# Patient Record
Sex: Female | Born: 1970 | Race: White | Hispanic: No | Marital: Married | Smoking: Current every day smoker
Health system: Southern US, Community
[De-identification: ages and names within clinical notes are randomized; demographics above are authoritative.]

## PROBLEM LIST (undated history)

## (undated) DIAGNOSIS — F1911 Other psychoactive substance abuse, in remission: Secondary | ICD-10-CM

## (undated) DIAGNOSIS — T7412XA Child physical abuse, confirmed, initial encounter: Secondary | ICD-10-CM

## (undated) DIAGNOSIS — E7800 Pure hypercholesterolemia, unspecified: Secondary | ICD-10-CM

## (undated) DIAGNOSIS — E785 Hyperlipidemia, unspecified: Secondary | ICD-10-CM

## (undated) DIAGNOSIS — M549 Dorsalgia, unspecified: Secondary | ICD-10-CM

## (undated) DIAGNOSIS — F1011 Alcohol abuse, in remission: Secondary | ICD-10-CM

## (undated) DIAGNOSIS — B179 Acute viral hepatitis, unspecified: Secondary | ICD-10-CM

## (undated) DIAGNOSIS — F319 Bipolar disorder, unspecified: Secondary | ICD-10-CM

## (undated) DIAGNOSIS — F4310 Post-traumatic stress disorder, unspecified: Secondary | ICD-10-CM

## (undated) HISTORY — DX: Dorsalgia, unspecified: M54.9

## (undated) HISTORY — DX: Child physical abuse, confirmed, initial encounter: T74.12XA

## (undated) HISTORY — DX: Bipolar disorder, unspecified: F31.9

## (undated) HISTORY — PX: ABDOMINAL HYSTERECTOMY: SHX81

## (undated) HISTORY — DX: Hyperlipidemia, unspecified: E78.5

## (undated) HISTORY — DX: Post-traumatic stress disorder, unspecified: F43.10

---

## 2001-03-27 ENCOUNTER — Inpatient Hospital Stay (HOSPITAL_COMMUNITY): Admission: EM | Admit: 2001-03-27 | Discharge: 2001-03-29 | Payer: Self-pay | Admitting: Psychiatry

## 2004-03-11 ENCOUNTER — Ambulatory Visit: Payer: Self-pay | Admitting: Psychiatry

## 2004-05-18 ENCOUNTER — Ambulatory Visit: Payer: Self-pay | Admitting: Psychiatry

## 2004-07-13 ENCOUNTER — Ambulatory Visit: Payer: Self-pay | Admitting: Psychiatry

## 2004-11-04 ENCOUNTER — Ambulatory Visit: Payer: Self-pay | Admitting: Psychiatry

## 2004-11-07 ENCOUNTER — Emergency Department (HOSPITAL_COMMUNITY): Admission: EM | Admit: 2004-11-07 | Discharge: 2004-11-07 | Payer: Self-pay | Admitting: Emergency Medicine

## 2004-11-16 ENCOUNTER — Emergency Department (HOSPITAL_COMMUNITY): Admission: EM | Admit: 2004-11-16 | Discharge: 2004-11-16 | Payer: Self-pay | Admitting: Emergency Medicine

## 2004-12-23 ENCOUNTER — Ambulatory Visit: Payer: Self-pay | Admitting: Psychiatry

## 2005-01-12 ENCOUNTER — Inpatient Hospital Stay (HOSPITAL_COMMUNITY): Admission: RE | Admit: 2005-01-12 | Discharge: 2005-01-13 | Payer: Self-pay | Admitting: Psychiatry

## 2005-02-17 ENCOUNTER — Ambulatory Visit: Payer: Self-pay | Admitting: Psychiatry

## 2005-04-26 ENCOUNTER — Ambulatory Visit: Payer: Self-pay | Admitting: Psychiatry

## 2005-07-21 ENCOUNTER — Ambulatory Visit: Payer: Self-pay | Admitting: Psychiatry

## 2005-09-22 ENCOUNTER — Ambulatory Visit (HOSPITAL_COMMUNITY): Payer: Self-pay | Admitting: Psychiatry

## 2005-10-20 ENCOUNTER — Ambulatory Visit (HOSPITAL_COMMUNITY): Payer: Self-pay | Admitting: Psychiatry

## 2006-01-10 ENCOUNTER — Ambulatory Visit (HOSPITAL_COMMUNITY): Payer: Self-pay | Admitting: Psychiatry

## 2006-03-09 ENCOUNTER — Ambulatory Visit (HOSPITAL_COMMUNITY): Payer: Self-pay | Admitting: Psychiatry

## 2006-05-04 ENCOUNTER — Ambulatory Visit (HOSPITAL_COMMUNITY): Payer: Self-pay | Admitting: Psychiatry

## 2006-06-29 ENCOUNTER — Ambulatory Visit (HOSPITAL_COMMUNITY): Payer: Self-pay | Admitting: Psychiatry

## 2006-08-24 ENCOUNTER — Ambulatory Visit (HOSPITAL_COMMUNITY): Payer: Self-pay | Admitting: Psychiatry

## 2006-10-24 ENCOUNTER — Ambulatory Visit (HOSPITAL_COMMUNITY): Payer: Self-pay | Admitting: Psychiatry

## 2006-12-21 ENCOUNTER — Ambulatory Visit (HOSPITAL_COMMUNITY): Payer: Self-pay | Admitting: Psychiatry

## 2007-04-17 ENCOUNTER — Ambulatory Visit (HOSPITAL_COMMUNITY): Payer: Self-pay | Admitting: Psychiatry

## 2007-06-05 ENCOUNTER — Ambulatory Visit (HOSPITAL_COMMUNITY): Payer: Self-pay | Admitting: Psychiatry

## 2007-08-02 ENCOUNTER — Ambulatory Visit (HOSPITAL_COMMUNITY): Payer: Self-pay | Admitting: Psychiatry

## 2007-11-02 ENCOUNTER — Ambulatory Visit: Payer: Self-pay | Admitting: Orthopedic Surgery

## 2007-11-02 ENCOUNTER — Emergency Department (HOSPITAL_COMMUNITY): Admission: EM | Admit: 2007-11-02 | Discharge: 2007-11-02 | Payer: Self-pay | Admitting: Emergency Medicine

## 2007-11-13 ENCOUNTER — Ambulatory Visit (HOSPITAL_COMMUNITY): Payer: Self-pay | Admitting: Psychiatry

## 2007-11-22 ENCOUNTER — Telehealth: Payer: Self-pay | Admitting: Orthopedic Surgery

## 2007-11-26 ENCOUNTER — Ambulatory Visit: Payer: Self-pay | Admitting: Orthopedic Surgery

## 2007-11-26 DIAGNOSIS — S53196A Other dislocation of unspecified ulnohumeral joint, initial encounter: Secondary | ICD-10-CM | POA: Insufficient documentation

## 2007-11-28 ENCOUNTER — Encounter: Payer: Self-pay | Admitting: Orthopedic Surgery

## 2007-12-10 ENCOUNTER — Telehealth: Payer: Self-pay | Admitting: Orthopedic Surgery

## 2008-01-10 ENCOUNTER — Ambulatory Visit (HOSPITAL_COMMUNITY): Payer: Self-pay | Admitting: Psychiatry

## 2008-03-11 ENCOUNTER — Ambulatory Visit (HOSPITAL_COMMUNITY): Payer: Self-pay | Admitting: Psychiatry

## 2008-07-15 ENCOUNTER — Ambulatory Visit (HOSPITAL_COMMUNITY): Payer: Self-pay | Admitting: Psychiatry

## 2008-10-09 ENCOUNTER — Ambulatory Visit (HOSPITAL_COMMUNITY): Payer: Self-pay | Admitting: Psychiatry

## 2008-11-06 ENCOUNTER — Ambulatory Visit (HOSPITAL_COMMUNITY): Payer: Self-pay | Admitting: Psychiatry

## 2008-12-02 ENCOUNTER — Emergency Department (HOSPITAL_COMMUNITY): Admission: EM | Admit: 2008-12-02 | Discharge: 2008-12-02 | Payer: Self-pay | Admitting: Emergency Medicine

## 2008-12-04 ENCOUNTER — Ambulatory Visit (HOSPITAL_COMMUNITY): Payer: Self-pay | Admitting: Psychiatry

## 2009-02-03 ENCOUNTER — Ambulatory Visit (HOSPITAL_COMMUNITY): Payer: Self-pay | Admitting: Psychiatry

## 2009-03-11 ENCOUNTER — Emergency Department (HOSPITAL_COMMUNITY): Admission: EM | Admit: 2009-03-11 | Discharge: 2009-03-12 | Payer: Self-pay | Admitting: Emergency Medicine

## 2009-03-12 ENCOUNTER — Ambulatory Visit (HOSPITAL_COMMUNITY): Admission: RE | Admit: 2009-03-12 | Discharge: 2009-03-12 | Payer: Self-pay | Admitting: Otolaryngology

## 2009-04-02 ENCOUNTER — Ambulatory Visit (HOSPITAL_COMMUNITY): Payer: Self-pay | Admitting: Psychiatry

## 2009-05-28 ENCOUNTER — Ambulatory Visit (HOSPITAL_COMMUNITY): Payer: Self-pay | Admitting: Psychiatry

## 2009-07-28 ENCOUNTER — Ambulatory Visit (HOSPITAL_COMMUNITY): Payer: Self-pay | Admitting: Psychiatry

## 2009-09-24 ENCOUNTER — Ambulatory Visit (HOSPITAL_COMMUNITY): Payer: Self-pay | Admitting: Psychiatry

## 2009-11-24 ENCOUNTER — Ambulatory Visit (HOSPITAL_COMMUNITY): Payer: Self-pay | Admitting: Psychiatry

## 2010-01-19 ENCOUNTER — Ambulatory Visit (HOSPITAL_COMMUNITY): Payer: Self-pay | Admitting: Psychiatry

## 2010-03-09 ENCOUNTER — Ambulatory Visit (HOSPITAL_COMMUNITY): Payer: Self-pay | Admitting: Psychiatry

## 2010-05-06 ENCOUNTER — Emergency Department (HOSPITAL_COMMUNITY): Admission: EM | Admit: 2010-05-06 | Discharge: 2010-05-06 | Payer: Self-pay | Admitting: Emergency Medicine

## 2010-05-11 ENCOUNTER — Ambulatory Visit (HOSPITAL_COMMUNITY): Payer: Self-pay | Admitting: Psychiatry

## 2010-06-07 ENCOUNTER — Ambulatory Visit (HOSPITAL_COMMUNITY): Payer: Self-pay | Admitting: Psychiatry

## 2010-06-21 ENCOUNTER — Ambulatory Visit (HOSPITAL_COMMUNITY): Payer: Self-pay | Admitting: Psychiatry

## 2010-07-08 ENCOUNTER — Ambulatory Visit (HOSPITAL_COMMUNITY)
Admission: RE | Admit: 2010-07-08 | Discharge: 2010-07-08 | Payer: Self-pay | Source: Home / Self Care | Attending: Psychiatry | Admitting: Psychiatry

## 2010-07-19 ENCOUNTER — Ambulatory Visit (HOSPITAL_COMMUNITY): Admit: 2010-07-19 | Payer: Self-pay | Admitting: Psychiatry

## 2010-09-02 ENCOUNTER — Encounter (INDEPENDENT_AMBULATORY_CARE_PROVIDER_SITE_OTHER): Payer: Medicaid Other | Admitting: Psychiatry

## 2010-09-02 DIAGNOSIS — F39 Unspecified mood [affective] disorder: Secondary | ICD-10-CM

## 2010-10-16 ENCOUNTER — Emergency Department (HOSPITAL_COMMUNITY)
Admission: EM | Admit: 2010-10-16 | Discharge: 2010-10-16 | Disposition: A | Discharge status: Home / Self Care | Payer: Medicaid Other | Attending: Emergency Medicine | Admitting: Emergency Medicine

## 2010-10-16 DIAGNOSIS — M545 Low back pain, unspecified: Secondary | ICD-10-CM | POA: Insufficient documentation

## 2010-10-16 DIAGNOSIS — W010XXA Fall on same level from slipping, tripping and stumbling without subsequent striking against object, initial encounter: Secondary | ICD-10-CM | POA: Insufficient documentation

## 2010-10-16 DIAGNOSIS — F3289 Other specified depressive episodes: Secondary | ICD-10-CM | POA: Insufficient documentation

## 2010-10-16 DIAGNOSIS — M546 Pain in thoracic spine: Secondary | ICD-10-CM | POA: Insufficient documentation

## 2010-10-16 DIAGNOSIS — IMO0002 Reserved for concepts with insufficient information to code with codable children: Secondary | ICD-10-CM | POA: Insufficient documentation

## 2010-10-16 DIAGNOSIS — F329 Major depressive disorder, single episode, unspecified: Secondary | ICD-10-CM | POA: Insufficient documentation

## 2010-10-16 DIAGNOSIS — Y9210 Unspecified residential institution as the place of occurrence of the external cause: Secondary | ICD-10-CM | POA: Insufficient documentation

## 2010-10-28 ENCOUNTER — Encounter (INDEPENDENT_AMBULATORY_CARE_PROVIDER_SITE_OTHER): Payer: Medicaid Other | Admitting: Psychiatry

## 2010-10-28 DIAGNOSIS — F39 Unspecified mood [affective] disorder: Secondary | ICD-10-CM

## 2010-11-02 NOTE — Consult Note (Signed)
Kari Le             ACCOUNT NO.:  [id number]   MEDICAL RECORD NO.:  [id number]          PATIENT TYPE:  EMS   LOCATION:  ED                            FACILITY:  [facility]   PHYSICIAN:  Vickki Hearing, M.D.DATE OF BIRTH:  08/05/1970   DATE OF CONSULTATION:  11/02/2007  DATE OF DISCHARGE:  11/02/2007                                 CONSULTATION   CHIEF COMPLAINT:  Dislocated right elbow.   HISTORY:  I was asked to consult on a patient, Kari Le, a 39-  year-old female who actually injured her elbow 1 day prior to admission  to the emergency room.  She said she was afraid of hospitals.  She  complained of severe pain, swelling, deformity, no numbness or tingling  but difficulty moving her right elbow.  She described the pain as 10/10.  Her review of systems essentially was negative.  She does have some  mental health issues no major medical history.  No drug abuse.  She does  smoke.   ALLERGIES:  None known.   MEDICATIONS:  Prozac 20 mg a day and Premarin oral once a day.  No  dosage given.   PHYSICAL EXAMINATION:  VITAL SIGNS:  Stable.  CONSTITUTIONAL:  She was well-developed and well-nourished in mild pain,  otherwise no distress.  She was awake, alert, and oriented x3.  Mood and  affect were normal.  CARDIOVASCULAR:  Normal pulse and perfusion to the right upper  extremity.  SKIN:  Intact.  NEUROLOGIC:  Normal.  EXTREMITIES:  Right elbow was deformed.  Pain and swelling was noted  around the elbow.  Joint effusion was noted.   After assessment looking at the x-rays, we decided to a closed reduction  under conscious sedation.  See report.   DIAGNOSIS:  Closed right posterior elbow dislocation.   PLAN:  Closed reduction under anesthesia with conscious sedation.      Vickki Hearing, M.D.  Electronically Signed     SEH/MEDQ  D:  11/28/2007  T:  11/29/2007  Job:  [id number]

## 2010-11-02 NOTE — Op Note (Signed)
NAMENILE, DORNING             ACCOUNT NO.:  [id number]   MEDICAL RECORD NO.:  [id number]          PATIENT TYPE:  EMS   LOCATION:  ED                            FACILITY:  [facility]   PHYSICIAN:  Vickki Hearing, M.D.DATE OF BIRTH:  [birth year]   DATE OF PROCEDURE:  11/02/2007  DATE OF DISCHARGE:  11/02/2007                               OPERATIVE REPORT   PREPROCEDURE DIAGNOSIS:  Right closed posterior elbow dislocation.   POSTPROCEDURE DIAGNOSIS:  Right closed posterior elbow dislocation.   PROCEDURE:  Closed reduction, right elbow under conscious sedation.   SURGEON:  Vickki Hearing, MD.   ANESTHETIC:  Versed and Diprivan.   OPERATIVE FINDINGS:  Posterolateral elbow dislocation, closed.   PROCEDURE:  After proper identification of the patient and site, consent  and history were done, administering of Versed and Diprivan, the patient  was sedated with stable vital signs.  Closed manipulation was performed  with standard maneuver of traction and countertraction and lateral and  medial pressure.  Elbow was reduced with a loud and palpable clock and  then radiographs were taken to confirm the reduction.  The patient was  placed in a posterior splint.   She was discharged on same day with pain medication and a request to  follow up in 1 week.   Dictation ended at this point.      Vickki Hearing, M.D.  Electronically Signed     SEH/MEDQ  D:  11/28/2007  T:  11/29/2007  Job:  [id number]

## 2010-11-05 NOTE — Discharge Summary (Signed)
[facility]  Patient:    Kari Le, Kari Le Visit Number: [id number] MRN: [id number]          Service Type: PSY Location: 50 [id number] Attending Physician:  Rachael Fee Dictated by:   Reymundo Poll Dub Mikes, M.D. Admit Date:  03/27/2001 Discharge Date: 03/29/2001                             Discharge Summary  CHIEF COMPLAINT AND HISTORY OF PRESENT ILLNESS:  This was the first admission to [facility] for this 40 year old female admitted for depression and self-inflicted injuries.  The depression had been increasing over the last several days prior to this admission, became very despondent, relapsed on alcohol and drug use, cut herself with scissors, feeling hopeless, afraid to tell her family of her drug use.  She realized what she had done, came to the emergency room.  Reported stress over the separation and impending divorce, turned to alcohol and crack cocaine. Sleeping has been decreased, reporting initial insomnia.  Appetite has been satisfactory.  No auditory or visual hallucinations or paranoia.  PAST PSYCHIATRIC HISTORY:  Saw Kari Le in [place] for the last several months prior to this admission.  SUBSTANCE ABUSE HISTORY:  Used to drink heavily.  Relapsed a few months ago. Last drink was two days prior to this admission, drinking beer.  Crack cocaine was smoked with her friend when her friend would come over.  PAST MEDICAL HISTORY:  Back problems.  MEDICATIONS ON ADMISSION: 1. Prozac 20 mg three times a day. 2. Xanax 1 mg four times a day. 3. Hormone replacement for hysterectomy.  PHYSICAL EXAMINATION:  GENERAL:  Performed at [facility] did show superficial abrasions to her left wrist, scant amount of drainage.  LABORATORY DATA:  Urine drug screen was positive for benzodiazepines and cocaine.  Urine was positive for bacteria.  MENTAL STATUS EXAMINATION ON ADMISSION:  Alert, cooperative, female  casually dressed, good eye contact.  Speech was normal and relevant.  Mood was depressed.  Affect was flat.  Thought processes were coherent.  No evidence of psychosis, no auditory or visual hallucinations, no suicidal or homicidal ideations.  Cognitive: Intact.  ADMITTING DIAGNOSES: Axis I:    1. Major depression, recurrent.            2. Recreational alcohol abuse.            3. Cocaine abuse. Axis II:   Deferred. Axis III:  Back pain. Axis IV:   Moderate. Axis V:    Global assessment of functioning upon admission 40, highest global            assessment of functioning in the last year 65-70.  HOSPITAL COURSE:  She was admitted and started in intensive individual and group psychotherapy.  She was given Prozac 20 mg per day and trazodone as needed for sleep.  She was given Bactrim for a urinary tract infection.  On October 10, she was in full contact with reality.   Mood had improved.  Affect was brighter.  She denied any suicidal or homicidal ideas.  She had worked on crisis intervention, coping skills, and stress management.  She felt that she was ready to go and follow up with Dr. Katrinka Blazing and her individual therapist. She was willing to pursue further recovery work, going to the [facility], for which discharge was requested and granted.  DISCHARGE DIAGNOSES: Axis I:  1. Major depression, recurrent.            2. Alcohol and cocaine abuse. Axis II:   No diagnosis. Axis III:  1. Back pain.            2. Urinary tract infection. Axis IV:   Moderate. Axis V:    Global assessment of functioning upon discharge 60.  DISCHARGE MEDICATIONS: 1. Prozac 20 mg daily. 2. Trazodone 50 mg at bedtime as needed for sleep. 3. Bactrim DS one twice a day for five more days.  FOLLOWUP:  Follow up with Kari Le, M.D. as well as an individual counselor. Dictated by:   Reymundo Poll Dub Mikes, M.D. Attending Physician:  Rachael Fee DD:  05/09/01 TD:  05/11/01 Job: [id number]

## 2010-11-05 NOTE — Discharge Summary (Signed)
NAMEKANISHA, Kari Le             ACCOUNT NO.:  [id number]   MEDICAL RECORD NO.:  [id number]          PATIENT TYPE:  IPS   LOCATION:  0501                          FACILITY:  [facility]   PHYSICIAN:  Anselm Jungling, MD  DATE OF BIRTH:  [birth year]   DATE OF ADMISSION:  01/12/2005  DATE OF DISCHARGE:  01/13/2005                                 DISCHARGE SUMMARY   IDENTIFYING DATA/REASON FOR ADMISSION:  This is the second [facility] admission for  Kari Le, a 40 year old separated mother, who was admitted due to increasing  depression, suicidal ideation, and polysubstance abuse.  She comes to [place]  with a history of bipolar affective disorder and polysubstance abuse.  She  was previously hospitalized here at [facility] approximately one year ago.  At the  time of admission, she was taking a regimen of medication including Prozac  40 mg daily, Risperdal, dosage unclear at bedtime, Xanax 1 mg q.i.d. and  trazodone 100 mg q.h.s.  Please refer to the admission note for further  details pertaining to the symptoms, circumstances and history that led to  her hospitalization.   INITIAL DIAGNOSTIC IMPRESSION:  AXIS I:  Bipolar affective disorder, by  history, currently depressed without psychotic features.  Polysubstance  abuse.  AXIS II:  Deferred.  AXIS III:  Allergy to TORADOL and DARVOCET.  AXIS IV:  Stressors:  Severe.  AXIS V:  GAF 30.   MEDICAL/LABORATORY:  The patient was physically examined and cleared at  [facility] prior to transfer to the inpatient psychiatric service at  [facility].  Her toxicology screen was positive for benzodiazepines and THC.   HOSPITAL COURSE:  The patient was admitted to the adult inpatient  psychiatric service.  Symmetrel 100 mg b.i.d. was ordered to address cocaine  craving and a Librium withdrawal protocol was introduced to address  potential alcohol withdrawal symptoms.  The patient presented as pleasant,  well-organized and, once admitted, she denied suicidal  ideation.   On the second hospital day, she requested discharge.  She indicated that she  was ready and willing to participate in outpatient follow-up treatment.  She  declined the offer of a family meeting.   AFTERCARE:  The patient was to follow up with Dr. Lolly Mustache in our outpatient  clinic, to be arranged at the time of discharge.   DISCHARGE MEDICATIONS:  She was instructed to continue her same medications  as above.  She still had a supply of all her medications at home and was  instructed to take them as directed on the labels.   DISCHARGE DIAGNOSES:  AXIS I:  Bipolar affective disorder, by history.  Polysubstance abuse.  AXIS II:  Deferred.  AXIS III:  Allergy to TORADOL and DARVOCET.  AXIS IV:  Stressors:  Severe.  AXIS V:  GAF on discharge 50.           ______________________________  Anselm Jungling, MD  Electronically Signed     SPB/MEDQ  D:  02/01/2005  T:  02/01/2005  Job:  [id number]

## 2010-12-28 ENCOUNTER — Encounter (HOSPITAL_COMMUNITY): Payer: Medicaid Other | Admitting: Psychiatry

## 2010-12-30 ENCOUNTER — Encounter (INDEPENDENT_AMBULATORY_CARE_PROVIDER_SITE_OTHER): Payer: Medicaid Other | Admitting: Psychiatry

## 2010-12-30 ENCOUNTER — Encounter (HOSPITAL_COMMUNITY): Payer: Medicaid Other | Admitting: Psychiatry

## 2010-12-30 DIAGNOSIS — F39 Unspecified mood [affective] disorder: Secondary | ICD-10-CM

## 2011-01-13 ENCOUNTER — Encounter: Payer: Self-pay | Admitting: *Deleted

## 2011-01-13 ENCOUNTER — Emergency Department (HOSPITAL_COMMUNITY)
Admission: EM | Admit: 2011-01-13 | Discharge: 2011-01-13 | Disposition: A | Discharge status: Home / Self Care | Payer: Medicaid Other | Attending: Emergency Medicine | Admitting: Emergency Medicine

## 2011-01-13 DIAGNOSIS — S39012A Strain of muscle, fascia and tendon of lower back, initial encounter: Secondary | ICD-10-CM

## 2011-01-13 DIAGNOSIS — X500XXA Overexertion from strenuous movement or load, initial encounter: Secondary | ICD-10-CM | POA: Insufficient documentation

## 2011-01-13 DIAGNOSIS — S335XXA Sprain of ligaments of lumbar spine, initial encounter: Secondary | ICD-10-CM | POA: Insufficient documentation

## 2011-01-13 DIAGNOSIS — F17200 Nicotine dependence, unspecified, uncomplicated: Secondary | ICD-10-CM | POA: Insufficient documentation

## 2011-01-13 HISTORY — DX: Pure hypercholesterolemia, unspecified: E78.00

## 2011-01-13 MED ORDER — CYCLOBENZAPRINE HCL 10 MG PO TABS: 10.0000 mg | ORAL_TABLET | Freq: Three times a day (TID) | ORAL | Status: AC | PRN

## 2011-01-13 MED ORDER — OXYCODONE-ACETAMINOPHEN 5-325 MG PO TABS: 1.0000 | ORAL_TABLET | ORAL | Status: AC | PRN

## 2011-01-13 NOTE — ED Provider Notes (Signed)
History     Chief Complaint  Patient presents with  . Back Pain   Patient is a 40 y.o. female presenting with back pain. The history is provided by the patient.  Back Pain  The current episode started yesterday. The problem occurs constantly. The problem has not changed since onset.The pain is associated with lifting heavy objects. The pain is present in the lumbar spine. The quality of the pain is described as aching. The pain does not radiate. The pain is mild. The symptoms are aggravated by bending, twisting and certain positions. The pain is the same all the time. Pertinent negatives include no chest pain, no fever, no numbness, no headaches, no abdominal pain, no abdominal swelling, no bowel incontinence, no perianal numbness, no bladder incontinence, no dysuria, no leg pain, no paresthesias and no weakness. She has tried nothing for the symptoms.    Past Medical History  Diagnosis Date  . High cholesterol     Past Surgical History  Procedure Date  . Abdominal hysterectomy     History reviewed. No pertinent family history.  History  Substance Use Topics  . Smoking status: Current Everyday Smoker -- 1.0 packs/day    Types: Cigarettes  . Smokeless tobacco: Not on file  . Alcohol Use: No    OB History    Grav Para Term Preterm Abortions TAB SAB Ect Mult Living                  Review of Systems  Constitutional: Negative for fever, chills and fatigue.  HENT: Negative for neck pain and neck stiffness.   Respiratory: Negative for cough, chest tightness and wheezing.   Cardiovascular: Negative.  Negative for chest pain.  Gastrointestinal: Negative for nausea, vomiting, abdominal pain, blood in stool, rectal pain and bowel incontinence.  Genitourinary: Negative for bladder incontinence, dysuria, frequency, hematuria, flank pain, decreased urine volume, vaginal bleeding, vaginal discharge and vaginal pain.  Musculoskeletal: Positive for back pain. Negative for joint swelling.   Skin: Negative.   Neurological: Negative for dizziness, weakness, numbness, headaches and paresthesias.  Hematological: Does not bruise/bleed easily.    Physical Exam  BP 114/89  Pulse 95  Temp(Src) 98 F (36.7 C) (Oral)  Resp 18  Ht 5' (1.524 m)  Wt 155 lb (70.308 kg)  BMI 30.27 kg/m2  SpO2 99%  Physical Exam  Nursing note and vitals reviewed. Constitutional: She appears well-developed and well-nourished.  Non-toxic appearance. She does not have a sickly appearance. No distress.  HENT:  Head: Normocephalic and atraumatic.  Eyes: Pupils are equal, round, and reactive to light.  Neck: Normal range of motion. Neck supple.  Cardiovascular: Normal rate, regular rhythm and normal heart sounds.   Pulmonary/Chest: Effort normal and breath sounds normal.  Abdominal: Soft. There is no tenderness.  Musculoskeletal: She exhibits tenderness. She exhibits no edema.       Lumbar back: She exhibits tenderness. She exhibits normal range of motion, no bony tenderness, no swelling, no spasm and normal pulse.       Back:  Lymphadenopathy:    She has no cervical adenopathy.  Neurological: She is alert. She has normal reflexes. She exhibits normal muscle tone. Coordination normal.  Skin: Skin is warm.    ED Course  Procedures  MDM  1500 Informed by triage nurse, that triage note regarding heart racing is charted on this patient by mistake.  Patient is ambulatory, moves all extremities well.  ttp of the lumbar paraspinal muscles.  No focal neuro deficits  or focal weakness.       Kingsley Farace L. Corley, [place] 01/13/11 1519

## 2011-01-13 NOTE — ED Notes (Signed)
Pt states has old elbow fracture and chronic back pain that was aggravated yesterday after assisting with moving heavy equipment.
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
## 2011-02-14 NOTE — ED Provider Notes (Signed)
History     CSN: [id number] Arrival date & time: 01/13/2011  1:38 PM  Chief Complaint  Patient presents with  . Back Pain   HPI  Past Medical History  Diagnosis Date  . High cholesterol     Past Surgical History  Procedure Date  . Abdominal hysterectomy     History reviewed. No pertinent family history.  History  Substance Use Topics  . Smoking status: Current Everyday Smoker -- 1.0 packs/day    Types: Cigarettes  . Smokeless tobacco: Not on file  . Alcohol Use: No    OB History    Grav Para Term Preterm Abortions TAB SAB Ect Mult Living                  Review of Systems  Physical Exam  BP 114/89  Pulse 95  Temp(Src) 98 F (36.7 C) (Oral)  Resp 18  Ht 5' (1.524 m)  Wt 155 lb (70.308 kg)  BMI 30.27 kg/m2  SpO2 99%  Physical Exam  ED Course  Procedures  MDM Medical screening examination/treatment/procedure(s) were performed by non-physician practitioner and as supervising physician I was immediately available for consultation/collaboration.      Donnetta Hutching, MD 02/14/11 [phone]

## 2011-03-01 ENCOUNTER — Encounter (INDEPENDENT_AMBULATORY_CARE_PROVIDER_SITE_OTHER): Payer: Medicaid Other | Admitting: Psychiatry

## 2011-03-01 DIAGNOSIS — F39 Unspecified mood [affective] disorder: Secondary | ICD-10-CM

## 2011-04-20 ENCOUNTER — Encounter (HOSPITAL_COMMUNITY): Payer: Self-pay | Admitting: *Deleted

## 2011-04-20 ENCOUNTER — Emergency Department (HOSPITAL_COMMUNITY)
Admission: EM | Admit: 2011-04-20 | Discharge: 2011-04-20 | Disposition: A | Discharge status: Home / Self Care | Payer: Medicaid Other | Attending: Emergency Medicine | Admitting: Emergency Medicine

## 2011-04-20 DIAGNOSIS — S39012A Strain of muscle, fascia and tendon of lower back, initial encounter: Secondary | ICD-10-CM

## 2011-04-20 DIAGNOSIS — S335XXA Sprain of ligaments of lumbar spine, initial encounter: Secondary | ICD-10-CM | POA: Insufficient documentation

## 2011-04-20 DIAGNOSIS — E7800 Pure hypercholesterolemia, unspecified: Secondary | ICD-10-CM | POA: Insufficient documentation

## 2011-04-20 DIAGNOSIS — X500XXA Overexertion from strenuous movement or load, initial encounter: Secondary | ICD-10-CM | POA: Insufficient documentation

## 2011-04-20 DIAGNOSIS — Z9079 Acquired absence of other genital organ(s): Secondary | ICD-10-CM | POA: Insufficient documentation

## 2011-04-20 DIAGNOSIS — F17200 Nicotine dependence, unspecified, uncomplicated: Secondary | ICD-10-CM | POA: Insufficient documentation

## 2011-04-20 MED ORDER — CYCLOBENZAPRINE HCL 10 MG PO TABS: ORAL_TABLET | ORAL | Status: DC

## 2011-04-20 MED ORDER — CYCLOBENZAPRINE HCL 10 MG PO TABS
10.0000 mg | ORAL_TABLET | Freq: Once | ORAL | Status: AC
  Administered 2011-04-20: 10 mg via ORAL
  Filled 2011-04-20: qty 1

## 2011-04-20 MED ORDER — IBUPROFEN 800 MG PO TABS
800.0000 mg | ORAL_TABLET | Freq: Once | ORAL | Status: AC
  Administered 2011-04-20: 800 mg via ORAL
  Filled 2011-04-20: qty 1

## 2011-04-20 NOTE — ED Notes (Signed)
Pt reports straining back while moving furniture.  Minimal relief with OTC medications.

## 2011-04-20 NOTE — ED Notes (Signed)
C/o back pain, pt states that she hurt her back while moving furniture,

## 2011-04-20 NOTE — ED Provider Notes (Signed)
History     CSN: [id number] Arrival date & time: 04/20/2011  7:52 PM   First MD Initiated Contact with Patient 04/20/11 1959      Chief Complaint  Patient presents with  . Back Pain    (Consider location/radiation/quality/duration/timing/severity/associated sxs/prior treatment) HPI Comments: Pt was helping her daughter move household belongings and strained her back.  Patient is a 40 y.o. female presenting with back pain. The history is provided by the patient. No language interpreter was used.  Back Pain  This is a new problem. The current episode started yesterday. The problem occurs constantly. The problem has not changed since onset.The pain is associated with lifting heavy objects. The pain is present in the lumbar spine. The quality of the pain is described as cramping. The pain does not radiate. The pain is moderate. The symptoms are aggravated by bending and twisting. She has tried NSAIDs for the symptoms. The treatment provided no relief.    Past Medical History  Diagnosis Date  . High cholesterol     Past Surgical History  Procedure Date  . Abdominal hysterectomy     History reviewed. No pertinent family history.  History  Substance Use Topics  . Smoking status: Current Everyday Smoker -- 1.0 packs/day    Types: Cigarettes  . Smokeless tobacco: Not on file  . Alcohol Use: No    OB History    Grav Para Term Preterm Abortions TAB SAB Ect Mult Living                  Review of Systems  Musculoskeletal: Positive for back pain.  All other systems reviewed and are negative.    Allergies  Hydrocodone; Toradol; and Ultram  Home Medications   Current Outpatient Rx  Name Route Sig Dispense Refill  . ALPRAZOLAM 1 MG PO TABS Oral Take 1 mg by mouth 3 (three) times daily.      Marland Kitchen ESTROGENS CONJUGATED 1.25 MG PO TABS Oral Take 1.25 mg by mouth daily.      Marland Kitchen FLUOXETINE HCL 40 MG PO CAPS Oral Take 40 mg by mouth daily.      Marland Kitchen MAGNESIUM SALICYLATE TETRAHYD 580  MG PO TABS Oral Take 1 tablet by mouth once as needed. For pain     . NAPROXEN SODIUM 220 MG PO TABS Oral Take 220 mg by mouth every 4 (four) hours. pain       BP 126/84  Pulse 102  Temp(Src) 97.7 F (36.5 C) (Oral)  Resp 18  Ht 5' (1.524 m)  Wt 160 lb (72.576 kg)  BMI 31.25 kg/m2  SpO2 100%  Physical Exam  Nursing note and vitals reviewed. Constitutional: She is oriented to person, place, and time. She appears well-developed and well-nourished. No distress.  HENT:  Head: Normocephalic and atraumatic.  Eyes: EOM are normal.  Neck: Normal range of motion.  Cardiovascular: Normal rate, regular rhythm and normal heart sounds.   Pulmonary/Chest: Effort normal and breath sounds normal.  Abdominal: Soft. She exhibits no distension. There is no tenderness.  Musculoskeletal:       Lumbar back: She exhibits decreased range of motion, tenderness and pain. She exhibits no bony tenderness, no deformity, no laceration and no spasm.       Back:  Neurological: She is alert and oriented to person, place, and time. She has normal strength. No cranial nerve deficit or sensory deficit. GCS eye subscore is 4. GCS verbal subscore is 5. GCS motor subscore is 6.  Reflex Scores:  Patellar reflexes are 2+ on the right side and 2+ on the left side.      Achilles reflexes are 2+ on the right side and 2+ on the left side. Skin: Skin is warm and dry.  Psychiatric: She has a normal mood and affect. Judgment normal.    ED Course  Procedures (including critical care time)  Labs Reviewed - No data to display No results found.   No diagnosis found.    MDM          Worthy Rancher, PA 04/20/11 [phone]

## 2011-04-21 NOTE — ED Provider Notes (Signed)
Medical screening examination/treatment/procedure(s) were performed by non-physician practitioner and as supervising physician I was immediately available for consultation/collaboration.  Konya Fauble, MD 04/21/11 0758 

## 2011-04-26 ENCOUNTER — Encounter (HOSPITAL_COMMUNITY): Payer: Self-pay | Admitting: Psychiatry

## 2011-04-26 ENCOUNTER — Ambulatory Visit (INDEPENDENT_AMBULATORY_CARE_PROVIDER_SITE_OTHER): Payer: Medicaid Other | Admitting: Psychiatry

## 2011-04-26 DIAGNOSIS — F319 Bipolar disorder, unspecified: Secondary | ICD-10-CM

## 2011-04-26 MED ORDER — RISPERIDONE 2 MG PO TABS: 2.0000 mg | ORAL_TABLET | Freq: Every day | ORAL | Status: DC

## 2011-04-26 MED ORDER — ALPRAZOLAM 1 MG PO TABS: 1.0000 mg | ORAL_TABLET | Freq: Three times a day (TID) | ORAL | Status: DC

## 2011-04-26 MED ORDER — BENZTROPINE MESYLATE 1 MG PO TABS: 1.0000 mg | ORAL_TABLET | Freq: Every day | ORAL | Status: DC

## 2011-04-26 MED ORDER — TRAZODONE HCL 150 MG PO TABS: 150.0000 mg | ORAL_TABLET | Freq: Every day | ORAL | Status: DC

## 2011-04-26 MED ORDER — FLUOXETINE HCL 40 MG PO CAPS: 40.0000 mg | ORAL_CAPSULE | Freq: Every day | ORAL | Status: DC

## 2011-04-26 NOTE — Patient Instructions (Signed)
Take your medications on regular basis and call us if you have any question or concern.

## 2011-04-26 NOTE — Progress Notes (Signed)
Patient came today for her followup appointment she has recently flulike symptoms and seen by her primary care doctor and now she is taking antibiotic for upper respiratory infection. She appears tired decreased energy and having some breathing problems. Overall she has been compliant with her psychiatric medication with no side effects. She reported her depression and anxiety and mood swings or under control. She's been sleeping 8-9 hours with the help of medication. She continued to endorse about family concerns and issues but lately things are much better. She denies any recent agitation anger or paranoid thinking. There are no abnormal movements noted. Mental status examination Patient is tired but casually dressed and appears to be her stated age. She described her mood okay and her affect was mood congruent. She denies any active or passive suicidal thinking homicidal thinking or any visual or auditory hallucination. Her attention and concentration were okay. Her thought process was slow but logical and linear. Her speech is soft and coherent. She is alert and oriented x3 and her insight judgment impulse control are fair. Assessment bipolar disorder type I Plan we will continue her medications which are respiratory Prozac Xanax trazodone Cogentin. I've explained the risks and benefits of medication in detail and I will see her again in 2 months.

## 2011-06-03 ENCOUNTER — Other Ambulatory Visit (HOSPITAL_COMMUNITY): Payer: Self-pay | Admitting: Psychiatry

## 2011-06-28 ENCOUNTER — Ambulatory Visit (INDEPENDENT_AMBULATORY_CARE_PROVIDER_SITE_OTHER): Payer: Medicaid Other | Admitting: Psychiatry

## 2011-06-28 DIAGNOSIS — F319 Bipolar disorder, unspecified: Secondary | ICD-10-CM

## 2011-06-28 MED ORDER — TRAZODONE HCL 150 MG PO TABS: 150.0000 mg | ORAL_TABLET | Freq: Every day | ORAL | Status: DC

## 2011-06-28 MED ORDER — BENZTROPINE MESYLATE 1 MG PO TABS: 1.0000 mg | ORAL_TABLET | Freq: Every day | ORAL | Status: DC

## 2011-06-28 MED ORDER — RISPERIDONE 2 MG PO TABS: 2.0000 mg | ORAL_TABLET | Freq: Every day | ORAL | Status: DC

## 2011-06-28 MED ORDER — ALPRAZOLAM 1 MG PO TABS: 1.0000 mg | ORAL_TABLET | Freq: Three times a day (TID) | ORAL | Status: DC

## 2011-06-28 MED ORDER — FLUOXETINE HCL 40 MG PO CAPS: 40.0000 mg | ORAL_CAPSULE | Freq: Every day | ORAL | Status: DC

## 2011-06-28 NOTE — Progress Notes (Signed)
Patient came today for her followup appointment she still  has some flulike symptoms but over all doing better. She was recently seen by her primary care physician Dr. Roseanne Reno and had extensive blood work. She has a quiet Christmas. She's relief and excited that no family dramma happened around Christmas. Her daughter is again pregnant and recently diagnosed with diabetes. Patient is concerned about her daughter who requires now injections to control her blood sugar. Patient enjoys spending time with her granddaughter. Overall she has been compliant with her psychiatric medication with no side effects. She reported her depression, anxiety and mood swings are under control. She's been sleeping good  with the help of medication. She continued to endorse stress about family concerns and issues but lately things are much better. She denies any recent agitation anger or paranoid thinking. There are no abnormal movements noted.  Mental status examination Patient is tired but casually dressed and appears to be her stated age. She described her mood okay and her affect was mood congruent. She denies any active or passive suicidal thinking homicidal thinking or any visual or auditory hallucination. Her attention and concentration were okay. Her thought process was slow but logical and linear. Her speech is soft and coherent. She is alert and oriented x3 and her insight judgment impulse control are fair.  Assessment Bipolar disorder type I  Plan  we will continue her medications Risperdal, Prozac Xanax trazodone and Cogentin. I will also get her consent to contact Dr. Bradly Bienenstock he for her blood work which has done recently.  I've explained the risks and benefits of medication in detail. I recommended to call us if she has any question or concern about the medication otherwise I will see her again in 2 months.

## 2011-08-17 ENCOUNTER — Emergency Department (HOSPITAL_COMMUNITY)
Admission: EM | Admit: 2011-08-17 | Discharge: 2011-08-17 | Disposition: A | Discharge status: Home / Self Care | Payer: Medicaid Other | Attending: Emergency Medicine | Admitting: Emergency Medicine

## 2011-08-17 ENCOUNTER — Encounter (HOSPITAL_COMMUNITY): Payer: Self-pay | Admitting: *Deleted

## 2011-08-17 ENCOUNTER — Emergency Department (HOSPITAL_COMMUNITY): Payer: Medicaid Other

## 2011-08-17 DIAGNOSIS — E7800 Pure hypercholesterolemia, unspecified: Secondary | ICD-10-CM | POA: Insufficient documentation

## 2011-08-17 DIAGNOSIS — Z87891 Personal history of nicotine dependence: Secondary | ICD-10-CM | POA: Insufficient documentation

## 2011-08-17 DIAGNOSIS — M545 Low back pain, unspecified: Secondary | ICD-10-CM | POA: Insufficient documentation

## 2011-08-17 DIAGNOSIS — S5000XA Contusion of unspecified elbow, initial encounter: Secondary | ICD-10-CM

## 2011-08-17 DIAGNOSIS — W010XXA Fall on same level from slipping, tripping and stumbling without subsequent striking against object, initial encounter: Secondary | ICD-10-CM | POA: Insufficient documentation

## 2011-08-17 DIAGNOSIS — S335XXA Sprain of ligaments of lumbar spine, initial encounter: Secondary | ICD-10-CM | POA: Insufficient documentation

## 2011-08-17 MED ORDER — OXYCODONE-ACETAMINOPHEN 5-325 MG PO TABS: 1.0000 | ORAL_TABLET | ORAL | Status: AC | PRN

## 2011-08-17 MED ORDER — OXYCODONE-ACETAMINOPHEN 5-325 MG PO TABS
1.0000 | ORAL_TABLET | Freq: Once | ORAL | Status: AC
  Administered 2011-08-17: 1 via ORAL
  Filled 2011-08-17: qty 1

## 2011-08-17 MED ORDER — CYCLOBENZAPRINE HCL 10 MG PO TABS: 10.0000 mg | ORAL_TABLET | Freq: Three times a day (TID) | ORAL | Status: AC | PRN

## 2011-08-17 NOTE — Discharge Instructions (Signed)
Back Pain, Adult Low back pain is very common. About 1 in 5 people have back pain.The cause of low back pain is rarely dangerous. The pain often gets better over time.About half of people with a sudden onset of back pain feel better in just 2 weeks. About 8 in 10 people feel better by 6 weeks.  CAUSES Some common causes of back pain include:  Strain of the muscles or ligaments supporting the spine.   Wear and tear (degeneration) of the spinal discs.   Arthritis.   Direct injury to the back.  DIAGNOSIS Most of the time, the direct cause of low back pain is not known.However, back pain can be treated effectively even when the exact cause of the pain is unknown.Answering your caregiver's questions about your overall health and symptoms is one of the most accurate ways to make sure the cause of your pain is not dangerous. If your caregiver needs more information, he or she may order lab work or imaging tests (X-rays or MRIs).However, even if imaging tests show changes in your back, this usually does not require surgery. HOME CARE INSTRUCTIONS For many people, back pain returns.Since low back pain is rarely dangerous, it is often a condition that people can learn to manageon their own.   Remain active. It is stressful on the back to sit or stand in one place. Do not sit, drive, or stand in one place for more than 30 minutes at a time. Take short walks on level surfaces as soon as pain allows.Try to increase the length of time you walk each day.   Do not stay in bed.Resting more than 1 or 2 days can delay your recovery.   Do not avoid exercise or work.Your body is made to move.It is not dangerous to be active, even though your back may hurt.Your back will likely heal faster if you return to being active before your pain is gone.   Pay attention to your body when you bend and lift. Many people have less discomfortwhen lifting if they bend their knees, keep the load close to their  bodies,and avoid twisting. Often, the most comfortable positions are those that put less stress on your recovering back.   Find a comfortable position to sleep. Use a firm mattress and lie on your side with your knees slightly bent. If you lie on your back, put a pillow under your knees.   Only take over-the-counter or prescription medicines as directed by your caregiver. Over-the-counter medicines to reduce pain and inflammation are often the most helpful.Your caregiver may prescribe muscle relaxant drugs.These medicines help dull your pain so you can more quickly return to your normal activities and healthy exercise.   Put ice on the injured area.   Put ice in a plastic bag.   Place a towel between your skin and the bag.   Leave the ice on for 15 to 20 minutes, 3 to 4 times a day for the first 2 to 3 days. After that, ice and heat may be alternated to reduce pain and spasms.   Ask your caregiver about trying back exercises and gentle massage. This may be of some benefit.   Avoid feeling anxious or stressed.Stress increases muscle tension and can worsen back pain.It is important to recognize when you are anxious or stressed and learn ways to manage it.Exercise is a great option.  SEEK MEDICAL CARE IF:  You have pain that is not relieved with rest or medicine.   You have   pain that does not improve in 1 week.   You have new symptoms.   You are generally not feeling well.  SEEK IMMEDIATE MEDICAL CARE IF:   You have pain that radiates from your back into your legs.   You develop new bowel or bladder control problems.   You have unusual weakness or numbness in your arms or legs.   You develop nausea or vomiting.   You develop abdominal pain.   You feel faint.  Document Released: 06/06/2005 Document Revised: 02/16/2011 Document Reviewed: 10/25/2010 [employer] Patient Information 2012 [employer].Contusion A contusion is a deep bruise. Bruises happen when an injury causes  bleeding under the skin. Signs of bruising include pain, puffiness (swelling), and discolored skin. The bruise may turn blue, purple, or yellow. HOME CARE   Rest the injured area until the pain and puffiness are better.   Try to limit use of the injured area as much as possible or as told by your doctor.   Put ice on the injured area.   Put ice in a plastic bag.   Place a towel between your skin and the bag.   Leave the ice on for 15 to 20 minutes, 3 to 4 times a day.   Raise (elevate) the injured area above the level of the heart.   Use an elastic bandage to lessen puffiness and motion.   Only take medicine as told by your doctor.   Eat healthy.   See your doctor for a follow-up visit.  GET HELP RIGHT AWAY IF:   There is more redness, puffiness, or pain.   You have a headache, muscle ache, or you feel dizzy and ill.   You have a fever.   The pain is not controlled with medicine.   The bruise is not getting better.   There is yellowish white fluid (pus) coming from the wound.   You lose feeling (numbness) in the injured area.   The bruised area feels cold.   There are new problems.  MAKE SURE YOU:   Understand these instructions.   Will watch your condition.   Will get help right away if you are not doing well or get worse.  Document Released: 11/23/2007 Document Revised: 02/16/2011 Document Reviewed: 11/23/2007 [employer] Patient Information 2012 [employer]. 

## 2011-08-17 NOTE — ED Notes (Signed)
Pt states that she fell yesterday evening and injured her lower leg and right elbow. Pt states that she has been taking ibuprofen, flexeril and tylenol with no relief.

## 2011-08-17 NOTE — ED Notes (Signed)
Patient transported to X-ray 

## 2011-08-19 NOTE — ED Provider Notes (Signed)
History     CSN: [id number]  Arrival date & time 08/17/11  1615   First MD Initiated Contact with Patient 08/17/11 1640      Chief Complaint  Patient presents with  . Back Pain    (Consider location/radiation/quality/duration/timing/severity/associated sxs/prior treatment) HPI Comments: Patient c/o pain to her right low back and right elbow that began after she tripped and fell.  He denies neck pain, headaches, vomiting or LOC.  He states she has been taking ibuprofen, flexeril and tylenol w/o relief of her pain.  Pain to her back is worse with standing and excessive sitting.  She denies incontinence, numbness, weakness or perineal numbness  Patient is a 41 y.o. female presenting with back pain. The history is provided by the patient. No language interpreter was used.  Back Pain  This is a new problem. The current episode started yesterday. The problem occurs constantly. The problem has not changed since onset.The pain is associated with falling. The pain is present in the lumbar spine. The quality of the pain is described as aching. The pain radiates to the right thigh. The pain is moderate. The symptoms are aggravated by certain positions, twisting and bending. The pain is the same all the time. Associated symptoms include leg pain. Pertinent negatives include no chest pain, no fever, no numbness, no abdominal pain, no abdominal swelling, no bowel incontinence, no perianal numbness, no bladder incontinence, no dysuria, no pelvic pain, no paresthesias, no paresis, no tingling and no weakness. She has tried NSAIDs and muscle relaxants for the symptoms. The treatment provided no relief.    Past Medical History  Diagnosis Date  . High cholesterol   . Back pain     Past Surgical History  Procedure Date  . Abdominal hysterectomy     History reviewed. No pertinent family history.  History  Substance Use Topics  . Smoking status: Former Smoker -- 1.0 packs/day    Types: Cigarettes   Quit date: 06/16/2011  . Smokeless tobacco: Not on file  . Alcohol Use: No    OB History    Grav Para Term Preterm Abortions TAB SAB Ect Mult Living                  Review of Systems  Constitutional: Negative for fever.  Respiratory: Negative for shortness of breath.   Cardiovascular: Negative for chest pain.  Gastrointestinal: Negative for abdominal pain and bowel incontinence.  Genitourinary: Negative for bladder incontinence, dysuria, hematuria, decreased urine volume, difficulty urinating and pelvic pain.  Musculoskeletal: Positive for back pain and arthralgias. Negative for joint swelling and gait problem.  Skin: Negative.   Neurological: Negative for dizziness, tingling, weakness, numbness and paresthesias.  All other systems reviewed and are negative.    Allergies  Darvocet; Hydrocodone; Toradol; and Ultram  Home Medications   Current Outpatient Rx  Name Route Sig Dispense Refill  . ACETAMINOPHEN 500 MG PO TABS Oral Take 1,000 mg by mouth as needed. For pain    . ALPRAZOLAM 1 MG PO TABS Oral Take 1 tablet (1 mg total) by mouth 3 (three) times daily. 90 tablet 1  . BENZTROPINE MESYLATE 1 MG PO TABS Oral Take 1 tablet (1 mg total) by mouth daily at 8 pm. 30 tablet 1  . CYCLOBENZAPRINE HCL 10 MG PO TABS  1/2 to one tablet TID for lower back pain 20 tablet 0  . ESTROGENS CONJUGATED 1.25 MG PO TABS Oral Take 1.25 mg by mouth daily.      Marland Kitchen  FLUOXETINE HCL 40 MG PO CAPS Oral Take 1 capsule (40 mg total) by mouth daily. 30 capsule 1  . IBUPROFEN 200 MG PO TABS Oral Take 400 mg by mouth as needed. For pain    . MENTHOL (TOPICAL ANALGESIC) 16 % EX LIQD Apply externally Apply 1 application topically as needed. For back pain    . RISPERIDONE 2 MG PO TABS Oral Take 1 tablet (2 mg total) by mouth daily at 8 pm. 30 tablet 1  . TRAZODONE HCL 150 MG PO TABS Oral Take 1 tablet (150 mg total) by mouth at bedtime. 30 tablet 1  . CYCLOBENZAPRINE HCL 10 MG PO TABS Oral Take 1 tablet (10 mg  total) by mouth 3 (three) times daily as needed for muscle spasms. 21 tablet 0  . OXYCODONE-ACETAMINOPHEN 5-325 MG PO TABS Oral Take 1 tablet by mouth every 4 (four) hours as needed for pain. 15 tablet 0    BP 95/62  Pulse 97  Temp(Src) 97.7 F (36.5 C) (Oral)  Resp 17  Ht 5\' 3"  (1.6 m)  Wt 160 lb (72.576 kg)  BMI 28.34 kg/m2  SpO2 98%  Physical Exam  Nursing note and vitals reviewed. Constitutional: She is oriented to person, place, and time.  Cardiovascular: Normal rate, regular rhythm, normal heart sounds and intact distal pulses.   Pulmonary/Chest: Effort normal and breath sounds normal. No respiratory distress.  Musculoskeletal: She exhibits tenderness. She exhibits no edema.       Right elbow: She exhibits normal range of motion, no swelling, no effusion, no deformity and no laceration. tenderness found. Lateral epicondyle tenderness noted. No radial head tenderness noted.       Lumbar back: She exhibits tenderness. She exhibits normal range of motion, no bony tenderness, no swelling, no edema, no spasm and normal pulse.       Back:       Arms: Neurological: She is alert and oriented to person, place, and time. No cranial nerve deficit or sensory deficit. She exhibits normal muscle tone. Coordination and gait normal.  Reflex Scores:      Patellar reflexes are 2+ on the right side and 2+ on the left side.      Achilles reflexes are 2+ on the right side and 2+ on the left side. Skin: Skin is warm and dry.    ED Course  Procedures (including critical care time)  Labs Reviewed - No data to display Dg Lumbar Spine Complete  08/17/2011  *RADIOLOGY REPORT*  Clinical Data: 41 year old female status post fall with pain.  LUMBAR SPINE - COMPLETE 4+ VIEW  Comparison: 11/07/2004.  Findings: Normal lumbar segmentation.  Stable and normal vertebral height alignment.  Relatively preserved disc spaces.  No pars fracture.  Stable and normal sacrum and SI joints.  IMPRESSION: No acute  fracture or listhesis identified in the lumbar spine.  Original Report Authenticated By: Harley Hallmark, M.D.   Dg Elbow Complete Right  08/17/2011  *RADIOLOGY REPORT*  Clinical Data: Fall with right elbow injury.  History of dislocation.  RIGHT ELBOW - COMPLETE 3+ VIEW  Comparison: 11/02/2007  Findings: No evidence of acute fracture or dislocation.  There is minimal prominence of the anterior fat pad in the lateral projection.  Small component of joint effusion cannot be completely excluded.  Soft tissues are unremarkable.  IMPRESSION: No acute fracture visualized.  Potential small joint effusion present.  Original Report Authenticated By: Reola Calkins, M.D.     1. Lumbar sprain   2.  Contusion of elbow       MDM    Patient has ttp of the lumbar paraspinal muscles, no focal neuro deficits,  She has mild tenderness of the lateral right elbow but she has full ROM of the elbow, shoulder and wrist.  She ambulates with a steady gait.   Patient / Family / Caregiver understand and agree with initial ED impression and plan with expectations set for ED visit. Pt stable in ED with no significant deterioration in condition. Pt feels improved after observation and/or treatment in ED.      Jessee Mezera L. [place], [place] 08/19/11 2235

## 2011-08-19 NOTE — ED Provider Notes (Signed)
Medical screening examination/treatment/procedure(s) were performed by non-physician practitioner and as supervising physician I was immediately available for consultation/collaboration.  Kikuye Korenek R. Croix Presley, MD 08/19/11 2336 

## 2011-08-25 ENCOUNTER — Ambulatory Visit (INDEPENDENT_AMBULATORY_CARE_PROVIDER_SITE_OTHER): Payer: Medicaid Other | Admitting: Psychiatry

## 2011-08-25 ENCOUNTER — Encounter (HOSPITAL_COMMUNITY): Payer: Self-pay | Admitting: Psychiatry

## 2011-08-25 DIAGNOSIS — F319 Bipolar disorder, unspecified: Secondary | ICD-10-CM

## 2011-08-25 MED ORDER — ALPRAZOLAM 1 MG PO TABS: 1.0000 mg | ORAL_TABLET | Freq: Three times a day (TID) | ORAL | Status: DC

## 2011-08-25 MED ORDER — RISPERIDONE 2 MG PO TABS: 2.0000 mg | ORAL_TABLET | Freq: Every day | ORAL | Status: DC

## 2011-08-25 MED ORDER — TRAZODONE HCL 150 MG PO TABS: 150.0000 mg | ORAL_TABLET | Freq: Every day | ORAL | Status: DC

## 2011-08-25 MED ORDER — FLUOXETINE HCL 40 MG PO CAPS: 40.0000 mg | ORAL_CAPSULE | Freq: Every day | ORAL | Status: DC

## 2011-08-25 MED ORDER — BENZTROPINE MESYLATE 1 MG PO TABS: 1.0000 mg | ORAL_TABLET | Freq: Every day | ORAL | Status: DC

## 2011-08-25 NOTE — Progress Notes (Signed)
Chief complaint I'm tired  History of present illness Patient is 41 year old female who came for her followup appointment. She has been complaining of tired and poor sleep recently. Her daughter is pregnant and due any time. She is taking care of grandkids and also trying to help her daughter. She spent night in the hospital but it turned out to be a false labor. She recently fell and bruise her elbow. She was given pain medication. Overall she has been compliant with medication and reported no side effects. She denies any agitation anger or mood swings. We are still waiting for collateral including blood work from her primary care physician. Overall her mood has been stable. She likes her current medication. She does not abuses her Xanax or ask her leading skills. She denies any drinking or using any illegal substances. There no tremors or shakes present.  Current psychiatric medication Xanax 1 mg 3 times a day Cogentin 1 mg at bedtime Risperdal 2 mg at bedtime Prozac 40 mg daily Trazodone 150 mg at bedtime  Medical history Hyperlipidemia and back pain  Mental status examination Patient is tired but casually dressed and appears to be her stated age.  she is cooperative and maintained good eye contact. She described her mood okay and her affect was mood congruent. She denies any active or passive suicidal thinking homicidal thinking or any visual or auditory hallucination. Her attention and concentration were okay.there were no psychotic symptoms present. There no tremors or shakes present.  Her thought process was slow but logical and linear. Her speech is soft and coherent. She is alert and oriented x3 and her insight judgment impulse control are fair.  Assessment Axis I Bipolar disorder type I Axis II deferred Axis III see medical history Axis IV mild to moderate  Plan  I will continue her medications Risperdal, Prozac Xanax trazodone and Cogentin. I have called her primary care  physician Dr. Bradly Bienenstock  to fax [place] recent blood work. I've explained the risks and benefits of medication in detail. I recommended to call us if she has any question or concern about the medication otherwise I will see her again in 2 months.

## 2011-09-16 ENCOUNTER — Emergency Department (HOSPITAL_COMMUNITY): Payer: Medicaid Other

## 2011-09-16 ENCOUNTER — Encounter (HOSPITAL_COMMUNITY): Payer: Self-pay

## 2011-09-16 ENCOUNTER — Emergency Department (HOSPITAL_COMMUNITY)
Admission: EM | Admit: 2011-09-16 | Discharge: 2011-09-16 | Disposition: A | Discharge status: Home / Self Care | Payer: Medicaid Other | Attending: Emergency Medicine | Admitting: Emergency Medicine

## 2011-09-16 DIAGNOSIS — S0083XA Contusion of other part of head, initial encounter: Secondary | ICD-10-CM

## 2011-09-16 DIAGNOSIS — H5710 Ocular pain, unspecified eye: Secondary | ICD-10-CM | POA: Insufficient documentation

## 2011-09-16 DIAGNOSIS — S61409A Unspecified open wound of unspecified hand, initial encounter: Secondary | ICD-10-CM | POA: Insufficient documentation

## 2011-09-16 DIAGNOSIS — M2550 Pain in unspecified joint: Secondary | ICD-10-CM | POA: Insufficient documentation

## 2011-09-16 DIAGNOSIS — Z87891 Personal history of nicotine dependence: Secondary | ICD-10-CM | POA: Insufficient documentation

## 2011-09-16 DIAGNOSIS — Y92009 Unspecified place in unspecified non-institutional (private) residence as the place of occurrence of the external cause: Secondary | ICD-10-CM | POA: Insufficient documentation

## 2011-09-16 DIAGNOSIS — S058X9A Other injuries of unspecified eye and orbit, initial encounter: Secondary | ICD-10-CM | POA: Insufficient documentation

## 2011-09-16 DIAGNOSIS — H1130 Conjunctival hemorrhage, unspecified eye: Secondary | ICD-10-CM | POA: Insufficient documentation

## 2011-09-16 DIAGNOSIS — S61419A Laceration without foreign body of unspecified hand, initial encounter: Secondary | ICD-10-CM

## 2011-09-16 MED ORDER — TOBRAMYCIN 0.3 % OP SOLN
2.0000 [drp] | Freq: Once | OPHTHALMIC | Status: AC
  Administered 2011-09-16: 2 [drp] via OPHTHALMIC
  Filled 2011-09-16: qty 5

## 2011-09-16 MED ORDER — OXYCODONE-ACETAMINOPHEN 5-325 MG PO TABS
1.0000 | ORAL_TABLET | Freq: Once | ORAL | Status: AC
  Administered 2011-09-16: 1 via ORAL
  Filled 2011-09-16: qty 1

## 2011-09-16 MED ORDER — KETOROLAC TROMETHAMINE 0.5 % OP SOLN
1.0000 [drp] | Freq: Once | OPHTHALMIC | Status: AC
  Administered 2011-09-16: 1 [drp] via OPHTHALMIC
  Filled 2011-09-16: qty 5

## 2011-09-16 MED ORDER — OXYCODONE-ACETAMINOPHEN 5-325 MG PO TABS: 1.0000 | ORAL_TABLET | ORAL | Status: AC | PRN
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
## 2011-09-16 NOTE — Discharge Instructions (Signed)
Contusion A contusion is a deep bruise. Contusions are the result of an injury that caused bleeding under the skin. The contusion may turn blue, purple, or yellow. Minor injuries will give you a painless contusion, but more severe contusions may stay painful and swollen for a few weeks.  CAUSES  A contusion is usually caused by a blow, trauma, or direct force to an area of the body. SYMPTOMS   Swelling and redness of the injured area.   Bruising of the injured area.   Tenderness and soreness of the injured area.   Pain.  DIAGNOSIS  The diagnosis can be made by taking a history and physical exam. An X-ray, CT scan, or MRI may be needed to determine if there were any associated injuries, such as fractures. TREATMENT  Specific treatment will depend on what area of the body was injured. In general, the best treatment for a contusion is resting, icing, elevating, and applying cold compresses to the injured area. Over-the-counter medicines may also be recommended for pain control. Ask your caregiver what the best treatment is for your contusion. HOME CARE INSTRUCTIONS   Put ice on the injured area.   Put ice in a plastic bag.   Place a towel between your skin and the bag.   Leave the ice on for 15 to 20 minutes, 3 to 4 times a day.   Only take over-the-counter or prescription medicines for pain, discomfort, or fever as directed by your caregiver. Your caregiver may recommend avoiding anti-inflammatory medicines (aspirin, ibuprofen, and naproxen) for 48 hours because these medicines may increase bruising.   Rest the injured area.   If possible, elevate the injured area to reduce swelling.  SEEK IMMEDIATE MEDICAL CARE IF:   You have increased bruising or swelling.   You have pain that is getting worse.   Your swelling or pain is not relieved with medicines.  MAKE SURE YOU:   Understand these instructions.   Will watch your condition.   Will get help right away if you are not  doing well or get worse.  Document Released: 03/16/2005 Document Revised: 05/26/2011 Document Reviewed: 04/11/2011 [facility] Patient Information 2012 [place], [place].Stitches, Staples, or Skin Adhesive Strips  Stitches (sutures), staples, and skin adhesive strips hold the skin together as it heals. They will usually be in place for 7 days or less. HOME CARE  Wash your hands with soap and water before and after you touch your wound.   Only take medicine as told by your doctor.   Cover your wound only if your doctor told you to. Otherwise, leave it open to air.   Do not get your stitches wet or dirty. If they get dirty, dab them gently with a clean washcloth. Wet the washcloth with soapy water. Do not rub. Pat them dry gently.   Do not put medicine or medicated cream on your stitches unless your doctor told you to.   Do not take out your own stitches or staples. Skin adhesive strips will fall off by themselves.   Do not pick at the wound. Picking can cause an infection.   Do not miss your follow-up appointment.   If you have problems or questions, call your doctor.  GET HELP RIGHT AWAY IF:   You have a temperature by mouth above 102 F (38.9 C), not controlled by medicine.   You have chills.   You have redness or pain around your stitches.   There is puffiness (swelling) around your stitches.  You notice fluid (drainage) from your stitches.   There is a bad smell coming from your wound.  MAKE SURE YOU:  Understand these instructions.   Will watch your condition.   Will get help if you are not doing well or get worse.  Document Released: 04/03/2009 Document Revised: 05/26/2011 Document Reviewed: 04/03/2009 [facility] Patient Information 2012 [place], [place].

## 2011-09-19 NOTE — ED Provider Notes (Signed)
Medical screening examination/treatment/procedure(s) were performed by non-physician practitioner and as supervising physician I was immediately available for consultation/collaboration.   Tonye Tancredi L Gema Ringold, MD 09/19/11 2250 

## 2011-10-25 ENCOUNTER — Encounter (HOSPITAL_COMMUNITY): Payer: Self-pay | Admitting: Psychiatry

## 2011-10-25 ENCOUNTER — Ambulatory Visit (INDEPENDENT_AMBULATORY_CARE_PROVIDER_SITE_OTHER): Payer: Medicaid Other | Admitting: Psychiatry

## 2011-10-25 VITALS — Wt 154.0 lb

## 2011-10-25 DIAGNOSIS — F319 Bipolar disorder, unspecified: Secondary | ICD-10-CM

## 2011-10-25 MED ORDER — RISPERIDONE 2 MG PO TABS: 2.0000 mg | ORAL_TABLET | Freq: Every day | ORAL | Status: DC

## 2011-10-25 MED ORDER — FLUOXETINE HCL 40 MG PO CAPS: 40.0000 mg | ORAL_CAPSULE | Freq: Every day | ORAL | Status: DC

## 2011-10-25 MED ORDER — ALPRAZOLAM 1 MG PO TABS: 1.0000 mg | ORAL_TABLET | Freq: Three times a day (TID) | ORAL | Status: DC

## 2011-10-25 MED ORDER — BENZTROPINE MESYLATE 1 MG PO TABS: 1.0000 mg | ORAL_TABLET | Freq: Every day | ORAL | Status: DC

## 2011-10-25 MED ORDER — TRAZODONE HCL 150 MG PO TABS: 150.0000 mg | ORAL_TABLET | Freq: Every day | ORAL | Status: DC
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
## 2011-12-15 ENCOUNTER — Ambulatory Visit (HOSPITAL_COMMUNITY): Payer: Self-pay | Admitting: Psychiatry

## 2011-12-20 ENCOUNTER — Ambulatory Visit (INDEPENDENT_AMBULATORY_CARE_PROVIDER_SITE_OTHER): Payer: Medicaid Other | Admitting: Psychiatry

## 2011-12-20 ENCOUNTER — Ambulatory Visit (HOSPITAL_COMMUNITY): Payer: Self-pay | Admitting: Psychiatry

## 2011-12-20 DIAGNOSIS — F319 Bipolar disorder, unspecified: Secondary | ICD-10-CM

## 2011-12-20 MED ORDER — FLUOXETINE HCL 40 MG PO CAPS: 40.0000 mg | ORAL_CAPSULE | Freq: Every day | ORAL | Status: DC

## 2011-12-20 MED ORDER — ALPRAZOLAM 1 MG PO TABS: 1.0000 mg | ORAL_TABLET | Freq: Three times a day (TID) | ORAL | Status: DC

## 2011-12-20 MED ORDER — TRAZODONE HCL 150 MG PO TABS: 150.0000 mg | ORAL_TABLET | Freq: Every day | ORAL | Status: DC

## 2011-12-20 MED ORDER — BENZTROPINE MESYLATE 1 MG PO TABS: 1.0000 mg | ORAL_TABLET | Freq: Every day | ORAL | Status: DC

## 2011-12-20 MED ORDER — RISPERIDONE 3 MG PO TABS: 3.0000 mg | ORAL_TABLET | Freq: Every day | ORAL | Status: DC
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
## 2011-12-23 ENCOUNTER — Emergency Department (HOSPITAL_COMMUNITY)
Admission: EM | Admit: 2011-12-23 | Discharge: 2011-12-23 | Disposition: A | Discharge status: Home / Self Care | Payer: Medicaid Other | Attending: Emergency Medicine | Admitting: Emergency Medicine

## 2011-12-23 ENCOUNTER — Encounter (HOSPITAL_COMMUNITY): Payer: Self-pay | Admitting: *Deleted

## 2011-12-23 DIAGNOSIS — S39012A Strain of muscle, fascia and tendon of lower back, initial encounter: Secondary | ICD-10-CM

## 2011-12-23 DIAGNOSIS — Z87891 Personal history of nicotine dependence: Secondary | ICD-10-CM | POA: Insufficient documentation

## 2011-12-23 DIAGNOSIS — M545 Low back pain, unspecified: Secondary | ICD-10-CM | POA: Insufficient documentation

## 2011-12-23 DIAGNOSIS — E785 Hyperlipidemia, unspecified: Secondary | ICD-10-CM | POA: Insufficient documentation

## 2011-12-23 DIAGNOSIS — Z90710 Acquired absence of both cervix and uterus: Secondary | ICD-10-CM | POA: Insufficient documentation

## 2011-12-23 DIAGNOSIS — F319 Bipolar disorder, unspecified: Secondary | ICD-10-CM | POA: Insufficient documentation

## 2011-12-23 DIAGNOSIS — Z818 Family history of other mental and behavioral disorders: Secondary | ICD-10-CM | POA: Insufficient documentation

## 2011-12-23 DIAGNOSIS — IMO0001 Reserved for inherently not codable concepts without codable children: Secondary | ICD-10-CM | POA: Insufficient documentation

## 2011-12-23 DIAGNOSIS — E7800 Pure hypercholesterolemia, unspecified: Secondary | ICD-10-CM | POA: Insufficient documentation

## 2011-12-23 LAB — URINALYSIS, ROUTINE W REFLEX MICROSCOPIC
Bilirubin Urine: NEGATIVE
Hgb urine dipstick: NEGATIVE
Protein, ur: NEGATIVE mg/dL
Urobilinogen, UA: 0.2 mg/dL (ref 0.0–1.0)

## 2011-12-23 MED ORDER — OXYCODONE-ACETAMINOPHEN 5-325 MG PO TABS: 1.0000 | ORAL_TABLET | ORAL | Status: AC | PRN

## 2011-12-23 MED ORDER — IBUPROFEN 600 MG PO TABS: 600.0000 mg | ORAL_TABLET | Freq: Three times a day (TID) | ORAL | Status: AC | PRN

## 2011-12-23 MED ORDER — OXYCODONE-ACETAMINOPHEN 5-325 MG PO TABS
1.0000 | ORAL_TABLET | Freq: Once | ORAL | Status: AC
  Administered 2011-12-23: 1 via ORAL
  Filled 2011-12-23: qty 1

## 2011-12-23 NOTE — ED Notes (Signed)
Pt sitting up on bed, took meds w/o difficulty

## 2011-12-23 NOTE — ED Notes (Signed)
Right sided lower back pain radiating to right flank x 2 days.  Denies new injury.  Denies n/v/d.

## 2011-12-23 NOTE — ED Provider Notes (Signed)
History     CSN: [id number]  Arrival date & time 12/23/11  1201   First MD Initiated Contact with Patient 12/23/11 1215      Chief Complaint  Patient presents with  . Back Pain    (Consider location/radiation/quality/duration/timing/severity/associated sxs/prior treatment) HPI Comments: Kari Le  presents with acute on chronic low back pain which has worsened x 2 days.  Patient denies any new injury specifically.  There is radiation into her right buttock.  There has been no weakness or numbness in the lower extremities and no urinary or bowel retention or incontinence.  She denies fevers chills or night sweats.  She's had no nausea vomiting or diarrhea.  She denied any urinary retention, incontinence or painful urination.  She notes no bowel changes.   The history is provided by the patient.    Past Medical History  Diagnosis Date  . High cholesterol   . Back pain   . Bipolar disorder   . Hyperlipidemia   . Back pain     Past Surgical History  Procedure Date  . Abdominal hysterectomy     Family History  Problem Relation Age of Onset  . Bipolar disorder Mother   . Bipolar disorder Maternal Aunt     History  Substance Use Topics  . Smoking status: Former Smoker -- 1.0 packs/day    Types: Cigarettes    Quit date: 06/16/2011  . Smokeless tobacco: Not on file  . Alcohol Use: No    OB History    Grav Para Term Preterm Abortions TAB SAB Ect Mult Living                  Review of Systems  Constitutional: Negative for fever.  HENT: Negative for sore throat and neck pain.   Respiratory: Negative for shortness of breath.   Gastrointestinal: Negative for nausea, abdominal pain, constipation and abdominal distention.  Genitourinary: Negative.  Negative for dysuria.  Musculoskeletal: Positive for back pain. Negative for joint swelling, arthralgias and gait problem.  Skin: Negative.  Negative for rash.  Neurological: Negative for weakness and numbness.     Allergies  Darvocet; Hydrocodone; Ketorolac tromethamine; and Ultram  Home Medications   Current Outpatient Rx  Name Route Sig Dispense Refill  . ACETAMINOPHEN 500 MG PO TABS Oral Take 1,000 mg by mouth as needed. For pain    . ALPRAZOLAM 1 MG PO TABS Oral Take 1 tablet (1 mg total) by mouth 3 (three) times daily. 90 tablet 1  . BENZTROPINE MESYLATE 1 MG PO TABS Oral Take 1 tablet (1 mg total) by mouth daily. 30 tablet 1  . CYCLOBENZAPRINE HCL 10 MG PO TABS  1/2 to one tablet TID for lower back pain 20 tablet 0  . ESTROGENS CONJUGATED 1.25 MG PO TABS Oral Take 1.25 mg by mouth daily.      . FENOFIBRATE 145 MG PO TABS Oral Take 145 mg by mouth daily.    Marland Kitchen FLUOXETINE HCL 40 MG PO CAPS Oral Take 1 capsule (40 mg total) by mouth daily. 30 capsule 1  . IBUPROFEN 200 MG PO TABS Oral Take 400 mg by mouth as needed. For pain    . IBUPROFEN 600 MG PO TABS Oral Take 1 tablet (600 mg total) by mouth every 8 (eight) hours as needed for pain. 15 tablet 0  . MENTHOL (TOPICAL ANALGESIC) 16 % EX LIQD Apply externally Apply 1 application topically as needed. For back pain    . OXYCODONE-ACETAMINOPHEN 5-325 MG  PO TABS Oral Take 1 tablet by mouth every 4 (four) hours as needed for pain. 10 tablet 0  . RISPERIDONE 3 MG PO TABS Oral Take 1 tablet (3 mg total) by mouth daily. 30 tablet 1  . TRAZODONE HCL 150 MG PO TABS Oral Take 1 tablet (150 mg total) by mouth at bedtime. 30 tablet 1    BP 109/78  Pulse 96  Temp 98.3 F (36.8 C) (Oral)  Resp 20  Ht 5' (1.524 m)  Wt 165 lb (74.844 kg)  BMI 32.22 kg/m2  SpO2 100%  Physical Exam  Nursing note and vitals reviewed. Constitutional: She appears well-developed and well-nourished.  HENT:  Head: Normocephalic.  Eyes: Conjunctivae are normal.  Neck: Normal range of motion. Neck supple.  Cardiovascular: Normal rate and intact distal pulses.        Pedal pulses normal.  Pulmonary/Chest: Effort normal.  Abdominal: Soft. Bowel sounds are normal. She  exhibits no distension and no mass.  Musculoskeletal: Normal range of motion. She exhibits no edema.       Lumbar back: She exhibits tenderness. She exhibits no swelling, no edema and no spasm.       Back:  Neurological: She is alert. She has normal strength. She displays no atrophy and no tremor. No sensory deficit. Gait normal.  Reflex Scores:      Patellar reflexes are 2+ on the right side and 2+ on the left side.      Achilles reflexes are 2+ on the right side and 2+ on the left side.      No strength deficit noted in hip and knee flexor and extensor muscle groups.  Ankle flexion and extension intact.  Skin: Skin is warm and dry.  Psychiatric: She has a normal mood and affect.    ED Course  Procedures (including critical care time)  Labs Reviewed  URINALYSIS, ROUTINE W REFLEX MICROSCOPIC - Abnormal; Notable for the following:    APPearance HAZY (*)     All other components within normal limits   No results found.   1. Strain of lumbar paraspinal muscle       MDM  Oxycodone,  Ibuprofen.  Heat,  Rest.   Discussed symptoms that should prompt emergent recheck.  Pt with no h/o ca,  IVDU.  No neuro deficit on exam or by history to suggest emergent or surgical presentation.           Burgess Amor, PA 12/23/11 1800

## 2011-12-24 NOTE — ED Provider Notes (Signed)
Medical screening examination/treatment/procedure(s) were performed by non-physician practitioner and as supervising physician I was immediately available for consultation/collaboration.   Katye Valek W Tyrina Hines, MD 12/24/11 0713 

## 2012-01-16 ENCOUNTER — Encounter (HOSPITAL_COMMUNITY): Payer: Self-pay

## 2012-01-16 ENCOUNTER — Emergency Department (HOSPITAL_COMMUNITY): Payer: Medicaid Other

## 2012-01-16 ENCOUNTER — Emergency Department (HOSPITAL_COMMUNITY)
Admission: EM | Admit: 2012-01-16 | Discharge: 2012-01-16 | Disposition: A | Discharge status: Home / Self Care | Payer: Medicaid Other | Attending: Emergency Medicine | Admitting: Emergency Medicine

## 2012-01-16 DIAGNOSIS — R109 Unspecified abdominal pain: Secondary | ICD-10-CM

## 2012-01-16 DIAGNOSIS — R319 Hematuria, unspecified: Secondary | ICD-10-CM | POA: Insufficient documentation

## 2012-01-16 DIAGNOSIS — N390 Urinary tract infection, site not specified: Secondary | ICD-10-CM | POA: Insufficient documentation

## 2012-01-16 DIAGNOSIS — Z90710 Acquired absence of both cervix and uterus: Secondary | ICD-10-CM | POA: Insufficient documentation

## 2012-01-16 LAB — URINALYSIS, ROUTINE W REFLEX MICROSCOPIC
Glucose, UA: NEGATIVE mg/dL
Hgb urine dipstick: NEGATIVE
Ketones, ur: NEGATIVE mg/dL
Protein, ur: NEGATIVE mg/dL

## 2012-01-16 MED ORDER — CEPHALEXIN 500 MG PO CAPS: 500.0000 mg | ORAL_CAPSULE | Freq: Three times a day (TID) | ORAL | Status: AC

## 2012-01-16 MED ORDER — DIPHENHYDRAMINE HCL 50 MG/ML IJ SOLN
50.0000 mg | Freq: Once | INTRAMUSCULAR | Status: AC
  Administered 2012-01-16: 50 mg via INTRAVENOUS
  Filled 2012-01-16: qty 1

## 2012-01-16 MED ORDER — CYCLOBENZAPRINE HCL 10 MG PO TABS: 10.0000 mg | ORAL_TABLET | Freq: Three times a day (TID) | ORAL | Status: AC | PRN

## 2012-01-16 MED ORDER — METOCLOPRAMIDE HCL 5 MG/ML IJ SOLN
10.0000 mg | Freq: Once | INTRAMUSCULAR | Status: AC
  Administered 2012-01-16: 10 mg via INTRAVENOUS
  Filled 2012-01-16: qty 2

## 2012-01-16 MED ORDER — KETOROLAC TROMETHAMINE 30 MG/ML IJ SOLN
30.0000 mg | Freq: Once | INTRAMUSCULAR | Status: AC
  Administered 2012-01-16: 30 mg via INTRAVENOUS
  Filled 2012-01-16: qty 1

## 2012-01-16 MED ORDER — DIAZEPAM 5 MG/ML IJ SOLN
5.0000 mg | Freq: Once | INTRAMUSCULAR | Status: AC
  Administered 2012-01-16: 5 mg via INTRAVENOUS
  Filled 2012-01-16: qty 2

## 2012-01-16 NOTE — ED Notes (Signed)
Complain if pain in right flank area. Also, blood in urine

## 2012-01-16 NOTE — ED Notes (Signed)
Patient with no complaints at this time. Respirations even and unlabored. Skin warm/dry. Discharge instructions reviewed with patient at this time. Patient given opportunity to voice concerns/ask questions. IV removed per policy and band-aid applied to site. Patient discharged at this time and left Emergency Department with steady gait.  

## 2012-01-16 NOTE — ED Provider Notes (Signed)
History  This chart was scribed for Ward Givens, MD by Bennett Scrape. This patient was seen in room [facility] and the patient's care was started at 3:18PM.  CSN: [id number]  Arrival date & time 01/16/12  1407   First MD Initiated Contact with Patient 01/16/12 1518      Chief Complaint  Patient presents with  . Flank Pain    Patient is a 41 y.o. female presenting with flank pain. The history is provided by the patient.  Flank Pain This is a new problem. The current episode started more than 2 days ago. The problem occurs constantly. The problem has been gradually worsening. Pertinent negatives include no chest pain, no abdominal pain, no headaches and no shortness of breath. Nothing relieves the symptoms. She has tried acetaminophen and rest for the symptoms. The treatment provided no relief.    AYAH COZZOLINO is a 41 y.o. female who presents to the Emergency Department complaining of 3 days of gradual onset, gradually worsening right flank pain with associated hematuria, nausea, emesis and periumbilical abdominal cramping. She reports 3 episodes of non-bloody emesis today. She states that the pain became constant yesterday and is worse with all positions and movement. She reports taking several different OTC medications without improvement in her symptoms. She has a h/o back pain but denies prior episodes of similar symptoms. She denies cough, fever, diarrhea and fever as associated symptoms. She also has a h/o HLD , bipolar disorder and hypercholesteremia. She is an occasional alcohol user and is a former smoker.  Dr. Annabelle Harman is PCP.   Past Medical History  Diagnosis Date  . High cholesterol   . Back pain   . Bipolar disorder   . Hyperlipidemia   . Back pain     Past Surgical History  Procedure Date  . Abdominal hysterectomy     Family History  Problem Relation Age of Onset  . Bipolar disorder Mother   . Bipolar disorder Maternal Aunt     History  Substance Use  Topics  . Smoking status: Former Smoker -- 1.0 packs/day    Types: Cigarettes    Quit date: 06/16/2011  . Smokeless tobacco: Not on file  . Alcohol Use: occasional  On disability for chronic back pain  No OB history provided.  Review of Systems  Respiratory: Negative for shortness of breath.   Cardiovascular: Negative for chest pain.  Gastrointestinal: Negative for nausea, vomiting, abdominal pain and diarrhea.  Genitourinary: Positive for hematuria and flank pain. Negative for dysuria and urgency.  Neurological: Negative for headaches.  All other systems reviewed and are negative.    Allergies  Darvocet; Hydrocodone; Ketorolac tromethamine; and Ultram  Home Medications   Current Outpatient Rx  Name Route Sig Dispense Refill  . ACETAMINOPHEN 500 MG PO TABS Oral Take 1,000 mg by mouth as needed. For pain    . ALPRAZOLAM 1 MG PO TABS Oral Take 1 tablet (1 mg total) by mouth 3 (three) times daily. 90 tablet 1  . BENZTROPINE MESYLATE 1 MG PO TABS Oral Take 1 tablet (1 mg total) by mouth daily. 30 tablet 1  . CYCLOBENZAPRINE HCL 10 MG PO TABS  1/2 to one tablet TID for lower back pain 20 tablet 0  . ESTROGENS CONJUGATED 1.25 MG PO TABS Oral Take 1.25 mg by mouth daily.      . FENOFIBRATE 145 MG PO TABS Oral Take 145 mg by mouth daily.    Marland Kitchen FLUOXETINE HCL 40 MG PO CAPS Oral  Take 1 capsule (40 mg total) by mouth daily. 30 capsule 1  . IBUPROFEN 200 MG PO TABS Oral Take 400 mg by mouth as needed. For pain    . MENTHOL (TOPICAL ANALGESIC) 16 % EX LIQD Apply externally Apply 1 application topically as needed. For back pain    . RISPERIDONE 3 MG PO TABS Oral Take 1 tablet (3 mg total) by mouth daily. 30 tablet 1  . TRAZODONE HCL 150 MG PO TABS Oral Take 1 tablet (150 mg total) by mouth at bedtime. 30 tablet 1    Triage Vitals: BP 109/71  Pulse 103  Temp 98 F (36.7 C) (Oral)  Resp 20  Ht 5' (1.524 m)  Wt 165 lb (74.844 kg)  BMI 32.22 kg/m2  SpO2 100%  Vital signs normal     Physical Exam  Nursing note and vitals reviewed. Constitutional: She is oriented to person, place, and time. She appears well-developed and well-nourished.  Non-toxic appearance. She does not appear ill. No distress.  HENT:  Head: Normocephalic and atraumatic.  Right Ear: External ear normal.  Left Ear: External ear normal.  Nose: Nose normal. No mucosal edema or rhinorrhea.  Mouth/Throat: Oropharynx is clear and moist and mucous membranes are normal. No dental abscesses or uvula swelling.  Eyes: Conjunctivae and EOM are normal. Pupils are equal, round, and reactive to light.  Neck: Normal range of motion and full passive range of motion without pain. Neck supple. No tracheal deviation present.  Cardiovascular: Normal rate, regular rhythm and normal heart sounds.  Exam reveals no gallop and no friction rub.   No murmur heard. Pulmonary/Chest: Effort normal and breath sounds normal. No respiratory distress. She has no wheezes. She has no rhonchi. She has no rales. She exhibits no tenderness and no crepitus.  Abdominal: Soft. Normal appearance and bowel sounds are normal. She exhibits no distension. There is no tenderness. There is no rebound and no guarding.  Musculoskeletal: Normal range of motion. She exhibits tenderness. She exhibits no edema.       Lumbar Paraspinous muscles are tender  Neurological: She is alert and oriented to person, place, and time. She has normal strength. No cranial nerve deficit.  Skin: Skin is warm, dry and intact. No rash noted. No erythema. No pallor.  Psychiatric: She has a normal mood and affect. Her speech is normal and behavior is normal. Her mood appears not anxious.    ED Course  Procedures (including critical care time)   Medications  ketorolac (TORADOL) 30 MG/ML injection 30 mg (30 mg Intravenous Given 01/16/12 1545)  metoCLOPramide (REGLAN) injection 10 mg (10 mg Intravenous Given 01/16/12 1548)  diphenhydrAMINE (BENADRYL) injection 50 mg (50 mg  Intravenous Given 01/16/12 1548)  diazepam (VALIUM) injection 5 mg (5 mg Intravenous Given 01/16/12 1545)    DIAGNOSTIC STUDIES: Oxygen Saturation is 100% on room air, normal by my interpretation.    COORDINATION OF CARE: 3:36PM-Informed pt of urinalysis results. iscussed treatment plan which includes a CT scan with pt at bedside and pt agreed to plan.  Results for orders placed during the hospital encounter of 01/16/12  URINALYSIS, ROUTINE W REFLEX MICROSCOPIC      Component Value Range   Color, Urine YELLOW  YELLOW   APPearance CLEAR  CLEAR   Specific Gravity, Urine 1.015  1.005 - 1.030   pH 6.0  5.0 - 8.0   Glucose, UA NEGATIVE  NEGATIVE mg/dL   Hgb urine dipstick NEGATIVE  NEGATIVE   Bilirubin Urine NEGATIVE  NEGATIVE   Ketones, ur NEGATIVE  NEGATIVE mg/dL   Protein, ur NEGATIVE  NEGATIVE mg/dL   Urobilinogen, UA 0.2  0.0 - 1.0 mg/dL   Nitrite NEGATIVE  NEGATIVE   Leukocytes, UA SMALL (*) NEGATIVE  URINE MICROSCOPIC-ADD ON      Component Value Range   Squamous Epithelial / LPF FEW (*) RARE   WBC, UA 7-10  <3 WBC/hpf   Bacteria, UA FEW (*) RARE   Laboratory interpretation all normal except UTI   Ct Abdomen Pelvis Wo Contrast  01/16/2012  *RADIOLOGY REPORT*  Clinical Data: Right flank pain, hematuria.  CT ABDOMEN AND PELVIS WITHOUT CONTRAST  Technique:  Multidetector CT imaging of the abdomen and pelvis was performed following the standard protocol without intravenous contrast.  Comparison: None.  Findings: Lung bases are clear.  No effusions.  Heart is normal size.  Soft tissue nodule adjacent to the spleen likely represents accessory spleen.  Liver, spleen, gallbladder, stomach, pancreas, adrenals and kidneys are unremarkable.  No renal or ureteral stones.  No hydronephrosis.  Appendix is visualized and is normal. Bowel grossly unremarkable. No free fluid, free air, or adenopathy.  Aorta is normal caliber. Incidentally noted is a circumaortic left renal vein.  Prior hysterectomy.   No adnexal masses.  Urinary bladder is unremarkable.  IMPRESSION: No renal or ureteral stones.  No hydronephrosis.  Normal appendix.  No acute findings.  Original Report Authenticated By: Cyndie Chime, M.D.     1. Flank pain   2. Urinary tract infection    New Prescriptions   CEPHALEXIN (KEFLEX) 500 MG CAPSULE    Take 1 capsule (500 mg total) by mouth 3 (three) times daily.   CYCLOBENZAPRINE (FLEXERIL) 10 MG TABLET    Take 1 tablet (10 mg total) by mouth 3 (three) times daily as needed for muscle spasms.    Plan discharge  Devoria Albe, MD, FACEP    MDM   I personally performed the services described in this documentation, which was scribed in my presence. The recorded information has been reviewed and considered.      Ward Givens, MD 01/16/12 2129

## 2012-02-21 ENCOUNTER — Encounter (HOSPITAL_COMMUNITY): Payer: Self-pay | Admitting: Psychiatry

## 2012-02-21 ENCOUNTER — Ambulatory Visit (INDEPENDENT_AMBULATORY_CARE_PROVIDER_SITE_OTHER): Payer: 59 | Admitting: Psychiatry

## 2012-02-21 VITALS — BP 124/88 | HR 96 | Wt 157.0 lb

## 2012-02-21 DIAGNOSIS — F319 Bipolar disorder, unspecified: Secondary | ICD-10-CM

## 2012-02-21 MED ORDER — TRAZODONE HCL 150 MG PO TABS: 150.0000 mg | ORAL_TABLET | Freq: Every day | ORAL | Status: DC

## 2012-02-21 MED ORDER — BENZTROPINE MESYLATE 1 MG PO TABS: 1.0000 mg | ORAL_TABLET | Freq: Every day | ORAL | Status: DC

## 2012-02-21 MED ORDER — FLUOXETINE HCL 40 MG PO CAPS: 40.0000 mg | ORAL_CAPSULE | Freq: Every day | ORAL | Status: DC

## 2012-02-21 MED ORDER — ALPRAZOLAM 1 MG PO TABS: 1.0000 mg | ORAL_TABLET | Freq: Three times a day (TID) | ORAL | Status: DC

## 2012-02-21 MED ORDER — RISPERIDONE 3 MG PO TABS: 3.0000 mg | ORAL_TABLET | Freq: Every day | ORAL | Status: DC
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
## 2012-04-19 ENCOUNTER — Encounter (HOSPITAL_COMMUNITY): Payer: Self-pay | Admitting: Psychiatry

## 2012-04-19 ENCOUNTER — Ambulatory Visit (INDEPENDENT_AMBULATORY_CARE_PROVIDER_SITE_OTHER): Payer: 59 | Admitting: Psychiatry

## 2012-04-19 VITALS — BP 124/78 | HR 92 | Ht 60.0 in | Wt 148.2 lb

## 2012-04-19 DIAGNOSIS — Z87891 Personal history of nicotine dependence: Secondary | ICD-10-CM | POA: Insufficient documentation

## 2012-04-19 DIAGNOSIS — F17200 Nicotine dependence, unspecified, uncomplicated: Secondary | ICD-10-CM

## 2012-04-19 DIAGNOSIS — F5105 Insomnia due to other mental disorder: Secondary | ICD-10-CM

## 2012-04-19 DIAGNOSIS — F329 Major depressive disorder, single episode, unspecified: Secondary | ICD-10-CM

## 2012-04-19 DIAGNOSIS — F319 Bipolar disorder, unspecified: Secondary | ICD-10-CM | POA: Insufficient documentation

## 2012-04-19 DIAGNOSIS — F419 Anxiety disorder, unspecified: Secondary | ICD-10-CM

## 2012-04-19 MED ORDER — FLUOXETINE HCL 40 MG PO CAPS: 40.0000 mg | ORAL_CAPSULE | Freq: Every day | ORAL | Status: DC

## 2012-04-19 MED ORDER — TRAZODONE HCL 150 MG PO TABS: 150.0000 mg | ORAL_TABLET | Freq: Every day | ORAL | Status: DC

## 2012-04-19 MED ORDER — BENZTROPINE MESYLATE 1 MG PO TABS: 1.0000 mg | ORAL_TABLET | Freq: Every day | ORAL | Status: DC

## 2012-04-19 MED ORDER — ARIPIPRAZOLE 15 MG PO TABS: 15.0000 mg | ORAL_TABLET | Freq: Every day | ORAL | Status: DC

## 2012-04-19 MED ORDER — ALPRAZOLAM 1 MG PO TABS: 1.0000 mg | ORAL_TABLET | Freq: Three times a day (TID) | ORAL | Status: DC

## 2012-04-19 MED ORDER — NICOTINE 7 MG/24HR TD PT24: 1.0000 | MEDICATED_PATCH | TRANSDERMAL | Status: DC
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
## 2012-04-19 NOTE — Patient Instructions (Addendum)
Abilify is for bipolar mood disorder and helps anxiety.  Nicoderm is to substitute for the nicotine in cigarettes.  STAY off the cigarettes.  YOUR last cigarette was Halloween 2013.  For smoking cessation, stay on 7 mg daily for 4 weeks.  Call the Smoking Cessation hotline at  431-722-2390  for support.  REMEMBER these cost the about the same as a pack of cigarettes a day, but when you stop, you have $5 a day more to spend on yourself and your health.

## 2012-05-21 ENCOUNTER — Ambulatory Visit (HOSPITAL_COMMUNITY): Payer: Self-pay | Admitting: Psychiatry

## 2012-05-28 ENCOUNTER — Encounter (HOSPITAL_COMMUNITY): Payer: Self-pay | Admitting: *Deleted

## 2012-05-28 ENCOUNTER — Emergency Department (HOSPITAL_COMMUNITY)
Admission: EM | Admit: 2012-05-28 | Discharge: 2012-05-28 | Disposition: A | Discharge status: Home / Self Care | Payer: Medicaid Other | Attending: Emergency Medicine | Admitting: Emergency Medicine

## 2012-05-28 DIAGNOSIS — Z79899 Other long term (current) drug therapy: Secondary | ICD-10-CM | POA: Insufficient documentation

## 2012-05-28 DIAGNOSIS — E7800 Pure hypercholesterolemia, unspecified: Secondary | ICD-10-CM | POA: Insufficient documentation

## 2012-05-28 DIAGNOSIS — E785 Hyperlipidemia, unspecified: Secondary | ICD-10-CM | POA: Insufficient documentation

## 2012-05-28 DIAGNOSIS — M549 Dorsalgia, unspecified: Secondary | ICD-10-CM | POA: Insufficient documentation

## 2012-05-28 DIAGNOSIS — Z9889 Other specified postprocedural states: Secondary | ICD-10-CM | POA: Insufficient documentation

## 2012-05-28 DIAGNOSIS — F17200 Nicotine dependence, unspecified, uncomplicated: Secondary | ICD-10-CM | POA: Insufficient documentation

## 2012-05-28 DIAGNOSIS — M79609 Pain in unspecified limb: Secondary | ICD-10-CM | POA: Insufficient documentation

## 2012-05-28 DIAGNOSIS — F319 Bipolar disorder, unspecified: Secondary | ICD-10-CM | POA: Insufficient documentation

## 2012-05-28 DIAGNOSIS — M79604 Pain in right leg: Secondary | ICD-10-CM

## 2012-05-28 DIAGNOSIS — G8929 Other chronic pain: Secondary | ICD-10-CM | POA: Insufficient documentation

## 2012-05-28 MED ORDER — CYCLOBENZAPRINE HCL 10 MG PO TABS: 10.0000 mg | ORAL_TABLET | Freq: Two times a day (BID) | ORAL | Status: DC | PRN

## 2012-05-28 MED ORDER — PREDNISONE 20 MG PO TABS: 60.0000 mg | ORAL_TABLET | Freq: Every day | ORAL | Status: DC

## 2012-05-28 MED ORDER — PREDNISONE 50 MG PO TABS
60.0000 mg | ORAL_TABLET | Freq: Once | ORAL | Status: AC
  Administered 2012-05-28: 60 mg via ORAL
  Filled 2012-05-28: qty 1

## 2012-05-28 MED ORDER — NAPROXEN 250 MG PO TABS
500.0000 mg | ORAL_TABLET | Freq: Once | ORAL | Status: AC
  Administered 2012-05-28: 500 mg via ORAL
  Filled 2012-05-28: qty 2

## 2012-05-28 MED ORDER — NAPROXEN 500 MG PO TABS: 500.0000 mg | ORAL_TABLET | Freq: Two times a day (BID) | ORAL | Status: DC

## 2012-05-28 NOTE — ED Notes (Signed)
Dr.cook to see pt, notified by rn prior of pts requests for pain meds.

## 2012-05-28 NOTE — ED Notes (Signed)
Pt now says she is having chest pain, ekg done

## 2012-05-28 NOTE — ED Notes (Signed)
Pt called out, rn to exam room, pt requesting meds for pain, again explained delay, pt nodded in understanding.

## 2012-05-28 NOTE — ED Provider Notes (Signed)
History  This chart was scribed for Donnetta Hutching, MD by Bennett Scrape, ED Scribe. This patient was seen in room APA07/APA07 and the patient's care was started at 3:06 PM.  CSN: [id number]  Arrival date & time 05/28/12  1309   First MD Initiated Contact with Patient 05/28/12 1506      Chief Complaint  Patient presents with  . Leg Pain    The history is provided by the patient. No language interpreter was used.   Kari Le is a 41 y.o. female brought in by ambulance, who presents to the Emergency Department complaining of one month of gradual onset, gradually worsening, constant bilateral leg pain that starts in her knees and radiates down into her ankles. She reports a recent medication switch from simvastatin to tricor by her PCP for her triglycerides around the same time as the symptoms started. She also expresses some mild concern over the symptoms being related to her family h/o MS, but she denies having a prior diagnosis of MS. She denies urinary symptoms, new back pain, fever, abdominal pain and nausea as associated symptoms. She has a h/o chronic back pain, bipolar disorder, and HLD. She is a current everyday smoker but denies alcohol use.  Dr. Olena Leatherwood is her PCP.  Past Medical History  Diagnosis Date  . High cholesterol   . Back pain   . Bipolar disorder   . Hyperlipidemia   . Back pain     Past Surgical History  Procedure Date  . Abdominal hysterectomy     Family History  Problem Relation Age of Onset  . Bipolar disorder Mother   . Bipolar disorder Maternal Aunt     History  Substance Use Topics  . Smoking status: Current Every Day Smoker -- 0.5 packs/day    Types: Cigarettes    Last Attempt to Quit: 06/16/2011  . Smokeless tobacco: Not on file  . Alcohol Use: No    No OB history provided.  Review of Systems  A complete 10 system review of systems was obtained and all systems are negative except as noted in the HPI and PMH.   Allergies   Darvocet; Hydrocodone; Ketorolac tromethamine; and Ultram  Home Medications   Current Outpatient Rx  Name  Route  Sig  Dispense  Refill  . ALPRAZOLAM 1 MG PO TABS   Oral   Take 1 tablet (1 mg total) by mouth 3 (three) times daily.   90 tablet   1   . ARIPIPRAZOLE 15 MG PO TABS   Oral   Take 1 tablet (15 mg total) by mouth daily.   30 tablet   1   . BENZTROPINE MESYLATE 1 MG PO TABS   Oral   Take 1 tablet (1 mg total) by mouth daily.   30 tablet   1   . ESTROGENS CONJUGATED 1.25 MG PO TABS   Oral   Take 1.25 mg by mouth daily.           . ACETAMINOPHEN 500 MG PO TABS   Oral   Take 1,000 mg by mouth as needed. For pain         . FLUOXETINE HCL 40 MG PO CAPS   Oral   Take 1 capsule (40 mg total) by mouth daily.   30 capsule   1   . IBUPROFEN 200 MG PO TABS   Oral   Take 400 mg by mouth as needed. For pain         .  SIMVASTATIN 20 MG PO TABS   Oral   Take 20 mg by mouth every evening.         . TRAZODONE HCL 150 MG PO TABS   Oral   Take 1 tablet (150 mg total) by mouth at bedtime.   30 tablet   1     Triage Vitals: BP 111/75  Pulse 63  Temp 97.4 F (36.3 C) (Oral)  Resp 16  Ht 5' (1.524 m)  Wt 162 lb (73.483 kg)  BMI 31.64 kg/m2  SpO2 99%  Physical Exam  Nursing note and vitals reviewed. Constitutional: She is oriented to person, place, and time. She appears well-developed and well-nourished.  HENT:  Head: Normocephalic and atraumatic.  Eyes: Conjunctivae normal and EOM are normal. Pupils are equal, round, and reactive to light.  Neck: Normal range of motion. Neck supple.  Cardiovascular: Normal rate, regular rhythm and normal heart sounds.   Pulmonary/Chest: Effort normal and breath sounds normal.  Abdominal: Soft. Bowel sounds are normal.  Musculoskeletal: Normal range of motion.       No signs of infection, no erythema, no edema in ankle, overlaying skin is normal, no tenderness to the calves, no bony tenderness  Neurological: She  is alert and oriented to person, place, and time.  Skin: Skin is warm and dry.  Psychiatric: She has a normal mood and affect.    ED Course  Procedures (including critical care time)  DIAGNOSTIC STUDIES: Oxygen Saturation is 99% on room air, normal by my interpretation.    COORDINATION OF CARE: 3:45 PM- Advised pt that I don't believe that her symptoms are related to her family MS but that I do believe it could be her recent medication switch. Discussed treatment plan which includes NSAIDs, muscle relaxer and prednisone with pt at bedside and pt agreed to plan. Will recommend pt stopping tricor and following up with her PCP. Pt requested narcotics which I denied.  4:00 PM-Ordered 500 mg naprosyn tablet and 60 mg prednisone tablet.  Labs Reviewed - No data to display No results found.   No diagnosis found.    MDM  Bilateral leg exam normal.  Recommend discontinuation of prior medication.  Rx prednisone 60 mg daily for 5 days, Naprosyn 500 mg #20 and Flexeril 10 mg #20      I personally performed the services described in this documentation, which was scribed in my presence. The recorded information has been reviewed and is accurate.    Donnetta Hutching, MD 05/28/12 1622

## 2012-05-28 NOTE — ED Notes (Signed)
Pt says she is feeling sob at times and thinks  She may be having a panic attack, no distress at present

## 2012-05-28 NOTE — ED Notes (Signed)
bil leg pain for 1 month Had been taking  simvastin  And taken off that, Now taking tricor.  And legs cont to hurt and numb at times.

## 2012-05-28 NOTE — ED Notes (Signed)
Pt out to desk stated she was ready to go and tired of waiting. Delay explained, pt cursing and talking to self down hallway,

## 2012-05-28 NOTE — ED Notes (Signed)
Pt reports bil leg pain for 1 month, has been seen by pmd in [place] for same. Now requesting pain meds, advised that would need to be seen by pmd. Delay explained. Pt nodded in understanding.

## 2012-06-14 ENCOUNTER — Encounter (HOSPITAL_COMMUNITY): Payer: Self-pay | Admitting: Psychiatry

## 2012-06-14 ENCOUNTER — Ambulatory Visit (INDEPENDENT_AMBULATORY_CARE_PROVIDER_SITE_OTHER): Payer: 59 | Admitting: Psychiatry

## 2012-06-14 VITALS — Wt 159.0 lb

## 2012-06-14 DIAGNOSIS — F419 Anxiety disorder, unspecified: Secondary | ICD-10-CM

## 2012-06-14 DIAGNOSIS — F5105 Insomnia due to other mental disorder: Secondary | ICD-10-CM | POA: Insufficient documentation

## 2012-06-14 DIAGNOSIS — F319 Bipolar disorder, unspecified: Secondary | ICD-10-CM

## 2012-06-14 DIAGNOSIS — F411 Generalized anxiety disorder: Secondary | ICD-10-CM

## 2012-06-14 DIAGNOSIS — F17200 Nicotine dependence, unspecified, uncomplicated: Secondary | ICD-10-CM

## 2012-06-14 MED ORDER — TRAZODONE HCL 150 MG PO TABS: ORAL_TABLET | ORAL | Status: DC

## 2012-06-14 MED ORDER — ARIPIPRAZOLE 20 MG PO TABS: 20.0000 mg | ORAL_TABLET | Freq: Every morning | ORAL | Status: DC

## 2012-06-14 MED ORDER — DULOXETINE HCL 20 MG PO CPEP: 20.0000 mg | ORAL_CAPSULE | Freq: Two times a day (BID) | ORAL | Status: DC

## 2012-06-14 MED ORDER — ALPRAZOLAM 1 MG PO TABS: 1.0000 mg | ORAL_TABLET | Freq: Three times a day (TID) | ORAL | Status: DC

## 2012-06-14 MED ORDER — BENZTROPINE MESYLATE 1 MG PO TABS: 1.0000 mg | ORAL_TABLET | Freq: Two times a day (BID) | ORAL | Status: DC

## 2012-06-14 NOTE — Patient Instructions (Signed)
Call if problems  Have a happy New Year.
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
## 2012-08-10 ENCOUNTER — Ambulatory Visit (INDEPENDENT_AMBULATORY_CARE_PROVIDER_SITE_OTHER): Payer: 59 | Admitting: Psychiatry

## 2012-08-10 ENCOUNTER — Encounter (HOSPITAL_COMMUNITY): Payer: Self-pay | Admitting: Psychiatry

## 2012-08-10 VITALS — Wt 150.0 lb

## 2012-08-10 DIAGNOSIS — F411 Generalized anxiety disorder: Secondary | ICD-10-CM

## 2012-08-10 DIAGNOSIS — F419 Anxiety disorder, unspecified: Secondary | ICD-10-CM

## 2012-08-10 DIAGNOSIS — F1911 Other psychoactive substance abuse, in remission: Secondary | ICD-10-CM | POA: Insufficient documentation

## 2012-08-10 DIAGNOSIS — F319 Bipolar disorder, unspecified: Secondary | ICD-10-CM

## 2012-08-10 DIAGNOSIS — F5105 Insomnia due to other mental disorder: Secondary | ICD-10-CM

## 2012-08-10 DIAGNOSIS — F17200 Nicotine dependence, unspecified, uncomplicated: Secondary | ICD-10-CM

## 2012-08-10 MED ORDER — METHOCARBAMOL 500 MG PO TABS: 500.0000 mg | ORAL_TABLET | Freq: Four times a day (QID) | ORAL | Status: DC

## 2012-08-10 MED ORDER — BENZTROPINE MESYLATE 1 MG PO TABS: 0.5000 mg | ORAL_TABLET | Freq: Two times a day (BID) | ORAL | Status: DC

## 2012-08-10 MED ORDER — ARIPIPRAZOLE 20 MG PO TABS: 20.0000 mg | ORAL_TABLET | Freq: Every morning | ORAL | Status: DC

## 2012-08-10 MED ORDER — DULOXETINE HCL 20 MG PO CPEP: 20.0000 mg | ORAL_CAPSULE | Freq: Two times a day (BID) | ORAL | Status: DC

## 2012-08-10 MED ORDER — TRAZODONE HCL 150 MG PO TABS: 150.0000 mg | ORAL_TABLET | Freq: Every day | ORAL | Status: DC

## 2012-08-10 MED ORDER — ALPRAZOLAM 1 MG PO TABS: 1.0000 mg | ORAL_TABLET | Freq: Three times a day (TID) | ORAL | Status: DC
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
## 2012-08-10 NOTE — Patient Instructions (Addendum)
Could use "Move Free" or "Osteo bi Flex" for arthritic pain.   The important ingredients are Chondrotin Sulfate and Glucosamine.  Tumeric is also helpful for arthritis.   Krill oil and cod liver oil may be helpful for arthritis.   [employer] is a great source for all of these.  [phone]  Call the Smoking Cessation hotline at  517-583-6802  for support.    Get back in to the office for an apportionment for the attention test.  Call if problems or concerns.

## 2012-09-04 ENCOUNTER — Ambulatory Visit (HOSPITAL_COMMUNITY): Payer: Self-pay | Admitting: Psychology

## 2012-09-07 ENCOUNTER — Encounter (HOSPITAL_COMMUNITY): Payer: Self-pay | Admitting: Psychiatry

## 2012-09-07 ENCOUNTER — Ambulatory Visit (INDEPENDENT_AMBULATORY_CARE_PROVIDER_SITE_OTHER): Payer: 59 | Admitting: Psychiatry

## 2012-09-07 VITALS — Wt 154.0 lb

## 2012-09-07 DIAGNOSIS — F419 Anxiety disorder, unspecified: Secondary | ICD-10-CM

## 2012-09-07 DIAGNOSIS — F5105 Insomnia due to other mental disorder: Secondary | ICD-10-CM

## 2012-09-07 DIAGNOSIS — F319 Bipolar disorder, unspecified: Secondary | ICD-10-CM

## 2012-09-07 DIAGNOSIS — F411 Generalized anxiety disorder: Secondary | ICD-10-CM

## 2012-09-07 DIAGNOSIS — M549 Dorsalgia, unspecified: Secondary | ICD-10-CM | POA: Insufficient documentation

## 2012-09-07 MED ORDER — DULOXETINE HCL 60 MG PO CPEP: 60.0000 mg | ORAL_CAPSULE | Freq: Every day | ORAL | Status: DC

## 2012-09-07 MED ORDER — METHOCARBAMOL 500 MG PO TABS: 500.0000 mg | ORAL_TABLET | Freq: Four times a day (QID) | ORAL | Status: DC

## 2012-09-07 MED ORDER — TRAZODONE HCL 150 MG PO TABS: 150.0000 mg | ORAL_TABLET | Freq: Every day | ORAL | Status: DC

## 2012-09-07 MED ORDER — ALPRAZOLAM 1 MG PO TABS: 1.0000 mg | ORAL_TABLET | Freq: Three times a day (TID) | ORAL | Status: DC

## 2012-09-07 MED ORDER — DULOXETINE HCL 30 MG PO CPEP: 30.0000 mg | ORAL_CAPSULE | Freq: Every day | ORAL | Status: DC
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
## 2012-09-14 ENCOUNTER — Emergency Department (HOSPITAL_COMMUNITY)
Admission: EM | Admit: 2012-09-14 | Discharge: 2012-09-14 | Disposition: A | Discharge status: Home / Self Care | Payer: Medicaid Other | Attending: Emergency Medicine | Admitting: Emergency Medicine

## 2012-09-14 ENCOUNTER — Encounter (HOSPITAL_COMMUNITY): Payer: Self-pay

## 2012-09-14 ENCOUNTER — Ambulatory Visit (INDEPENDENT_AMBULATORY_CARE_PROVIDER_SITE_OTHER): Payer: 59 | Admitting: Psychology

## 2012-09-14 DIAGNOSIS — S39012A Strain of muscle, fascia and tendon of lower back, initial encounter: Secondary | ICD-10-CM

## 2012-09-14 DIAGNOSIS — S7001XA Contusion of right hip, initial encounter: Secondary | ICD-10-CM

## 2012-09-14 DIAGNOSIS — F411 Generalized anxiety disorder: Secondary | ICD-10-CM

## 2012-09-14 DIAGNOSIS — F419 Anxiety disorder, unspecified: Secondary | ICD-10-CM

## 2012-09-14 DIAGNOSIS — E785 Hyperlipidemia, unspecified: Secondary | ICD-10-CM | POA: Insufficient documentation

## 2012-09-14 DIAGNOSIS — F489 Nonpsychotic mental disorder, unspecified: Secondary | ICD-10-CM

## 2012-09-14 DIAGNOSIS — F319 Bipolar disorder, unspecified: Secondary | ICD-10-CM | POA: Insufficient documentation

## 2012-09-14 DIAGNOSIS — Z87891 Personal history of nicotine dependence: Secondary | ICD-10-CM | POA: Insufficient documentation

## 2012-09-14 DIAGNOSIS — W11XXXA Fall on and from ladder, initial encounter: Secondary | ICD-10-CM | POA: Insufficient documentation

## 2012-09-14 DIAGNOSIS — S7000XA Contusion of unspecified hip, initial encounter: Secondary | ICD-10-CM | POA: Insufficient documentation

## 2012-09-14 DIAGNOSIS — Y929 Unspecified place or not applicable: Secondary | ICD-10-CM | POA: Insufficient documentation

## 2012-09-14 DIAGNOSIS — Z8659 Personal history of other mental and behavioral disorders: Secondary | ICD-10-CM | POA: Insufficient documentation

## 2012-09-14 DIAGNOSIS — IMO0002 Reserved for concepts with insufficient information to code with codable children: Secondary | ICD-10-CM | POA: Insufficient documentation

## 2012-09-14 DIAGNOSIS — E7800 Pure hypercholesterolemia, unspecified: Secondary | ICD-10-CM | POA: Insufficient documentation

## 2012-09-14 DIAGNOSIS — Z79899 Other long term (current) drug therapy: Secondary | ICD-10-CM | POA: Insufficient documentation

## 2012-09-14 DIAGNOSIS — S335XXA Sprain of ligaments of lumbar spine, initial encounter: Secondary | ICD-10-CM | POA: Insufficient documentation

## 2012-09-14 DIAGNOSIS — Y9389 Activity, other specified: Secondary | ICD-10-CM | POA: Insufficient documentation

## 2012-09-14 DIAGNOSIS — F5105 Insomnia due to other mental disorder: Secondary | ICD-10-CM

## 2012-09-14 MED ORDER — METHOCARBAMOL 500 MG PO TABS
1000.0000 mg | ORAL_TABLET | Freq: Once | ORAL | Status: AC
  Administered 2012-09-14: 1000 mg via ORAL
  Filled 2012-09-14: qty 2

## 2012-09-14 MED ORDER — METHOCARBAMOL 500 MG PO TABS: ORAL_TABLET | ORAL | Status: DC
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
## 2012-09-14 NOTE — ED Provider Notes (Signed)
Medical screening examination/treatment/procedure(s) were performed by non-physician practitioner and as supervising physician I was immediately available for consultation/collaboration.   Laray Anger, DO 09/14/12 1858

## 2012-09-14 NOTE — ED Notes (Signed)
Pt reports was on a step ladder, lost her balance, and fell last night.  C/O pain in lower back and r hip area.  Pt ambulatory.  Has been using icy hot and ibuprofen.

## 2012-09-17 ENCOUNTER — Encounter (HOSPITAL_COMMUNITY): Payer: Self-pay | Admitting: Psychology

## 2012-09-17 NOTE — Progress Notes (Signed)
The patient came in today after being referred by Dr. walker do to her stated concerns that she may have attention deficit disorder. The patient knowledge that she had used various family members psychostimulant medications and experienced a relief in her symptoms. There is a family history of her daughter being diagnosed with attention deficit disorder. However, the patient has a long history of diagnosis of bipolar disorder. The patient and talk with Dr. walker about a prescription for Adderall to help alleviate her symptoms. However, given the patient's long history of significant mood disorder as well as her reports of significant anxiety and use of benzodiazepine and this underlying possibility needed formal testing and documentation. The patient reports that she did have a history of attentional problems in schoo but also acknowledged mood disturbance in school. She reports that she is always had attentional problems but it is worse now than had been. The patient is taking 1 mg Xanax 3 times a day and has been doing so for years. She also significant sleep disturbance as well. Chronic pain is also an issue. The patient was informed as to why the formal testing would need to be done in the need for documentation if her request for psychostimulant was going to be appropriately addressed. The patient was very hesitant and unsure about what type of testing would be done. I informed her that Xanax has a known side effect against memory and attention concentration issues and that a psychostimulant may be problematic for her long-term anxiety symptoms. However, the patient began to get somewhat agitated with this reason for this testing and the concern about the use of benzodiazepine and and psychostimulant simultaneously even if we found clear attentional problems. She was instructed that she would need to be free of Xanax for at least 24 hours for this testing to be done. This appeared to be a problem for her or at  least a concern for how she would do. I did offer to do the testing but she was going to think about that before he proceeded with a formal testing procedures.  If the patient return for formal testing he would consist of the comprehensive attention battery and the cab CPT measure.

## 2012-10-04 ENCOUNTER — Ambulatory Visit (INDEPENDENT_AMBULATORY_CARE_PROVIDER_SITE_OTHER): Payer: 59 | Admitting: Psychiatry

## 2012-10-04 ENCOUNTER — Encounter (HOSPITAL_COMMUNITY): Payer: Self-pay | Admitting: Psychiatry

## 2012-10-04 VITALS — Wt 149.8 lb

## 2012-10-04 DIAGNOSIS — F411 Generalized anxiety disorder: Secondary | ICD-10-CM

## 2012-10-04 DIAGNOSIS — F1911 Other psychoactive substance abuse, in remission: Secondary | ICD-10-CM

## 2012-10-04 DIAGNOSIS — F419 Anxiety disorder, unspecified: Secondary | ICD-10-CM

## 2012-10-04 DIAGNOSIS — M549 Dorsalgia, unspecified: Secondary | ICD-10-CM

## 2012-10-04 DIAGNOSIS — F17200 Nicotine dependence, unspecified, uncomplicated: Secondary | ICD-10-CM

## 2012-10-04 DIAGNOSIS — F319 Bipolar disorder, unspecified: Secondary | ICD-10-CM

## 2012-10-04 DIAGNOSIS — Z87891 Personal history of nicotine dependence: Secondary | ICD-10-CM

## 2012-10-04 DIAGNOSIS — F5105 Insomnia due to other mental disorder: Secondary | ICD-10-CM

## 2012-10-04 MED ORDER — DULOXETINE HCL 60 MG PO CPEP: 60.0000 mg | ORAL_CAPSULE | Freq: Every day | ORAL | Status: DC

## 2012-10-04 MED ORDER — TRAZODONE HCL 150 MG PO TABS: 150.0000 mg | ORAL_TABLET | Freq: Every day | ORAL | Status: DC

## 2012-10-04 MED ORDER — ALPRAZOLAM 1 MG PO TABS: 1.0000 mg | ORAL_TABLET | Freq: Three times a day (TID) | ORAL | Status: DC

## 2012-10-04 MED ORDER — ARIPIPRAZOLE 20 MG PO TABS: 20.0000 mg | ORAL_TABLET | Freq: Every morning | ORAL | Status: DC

## 2012-10-04 MED ORDER — BENZTROPINE MESYLATE 1 MG PO TABS: 0.5000 mg | ORAL_TABLET | Freq: Two times a day (BID) | ORAL | Status: DC

## 2012-10-04 MED ORDER — METHOCARBAMOL 500 MG PO TABS: ORAL_TABLET | ORAL | Status: DC

## 2012-10-04 MED ORDER — DULOXETINE HCL 30 MG PO CPEP: 30.0000 mg | ORAL_CAPSULE | Freq: Every day | ORAL | Status: DC
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
## 2012-10-04 NOTE — Patient Instructions (Signed)
CUT BACK/CUT OUT on sugar and carbohydrates, that means very limited fruits and starchy vegetables and very limited grains, breads  The goal is low GLYCEMIC INDEX.  CUT OUT all wheat, rye, or barley for the GLUTEN in them.  HIGH fat and LOW carbohydrate diet is the KEY.  Eat avocados, eggs, lean meat like grass fed beef and chicken  Nuts and seeds would be good foods as well.   Stevia is an excellent sweetener.  Safe for the brain.   Lowella Grip is also a good safe sweetener, not the baking blend form of Truvia  Almond butter is awesome.  Check out all this on the Internet.  Dr Heber Adel is on the Internet with some good info about this.   http://www.drperlmutter.com is where that is.  An excellent site for info on this diet is http://paleoleap.com  [employer] makes dark chocolate that is sweetened with Stevia that is safe.  Relaxation is the ultimate solution for you.  You can seek it through tub baths, bubble baths, essential oils or incense, walking or chatting with friends, listening to soft music, watching a candle burn and just letting all thoughts go and appreciating the true essence of the Creator.  Pets or animals may be very helpful.  You might spend some time with them and then go do more directed meditation.  Yoga is a very helpful exercise method.  On TV, on line, or by DVD Adelfa Koh is a source of high quality information about yoga and videos on yoga.  Renee Ramus is the world's number one video yoga instructor according to some experts.  There are exceptional health benefits that can be achieved through yoga.  The main principles of yoga is acceptance, no competition, no comparison, and no judgement.  It is exceptional in helping people meditate and get to a very relaxed state.   Set a timer for 8 or a certain number minutes and walk for that amount of time in the house or in the yard.  Mark the number of minutes on a calendar for that day.  Do that every day this week.   Then next week increase the time by 1 minutes and then mark the calendar with the number of minutes for that day.  Each week increase your exercise by one minute.  Keep a record of this so you can see the progress you are making.  Do this every day, just like eating and sleeping.  It is good for pain control, depression, and for your soul/spirit.  Bring the record in for your next visit so we can talk about your effort and how you feel with the new exercise program going and working for you.  Take care of yourself.  No one else is standing up to do the job and only you know what you need.   GET SERIOUS about taking care of yourself.  Do the next right thing and that often means doing something to care for yourself along the lines of are you hungry, are you angry, are you lonely, are you tired, are you scared?  [employer] is what that stands for.  Call if problems or concerns.

## 2012-11-01 ENCOUNTER — Ambulatory Visit (HOSPITAL_COMMUNITY): Payer: Self-pay | Admitting: Psychiatry

## 2012-11-06 ENCOUNTER — Inpatient Hospital Stay (HOSPITAL_COMMUNITY)
Admission: EM | Admit: 2012-11-06 | Discharge: 2012-11-08 | DRG: 442 | Disposition: A | Discharge status: Home / Self Care | Payer: Medicaid Other | Attending: Internal Medicine | Admitting: Internal Medicine

## 2012-11-06 ENCOUNTER — Emergency Department (HOSPITAL_COMMUNITY): Payer: Medicaid Other

## 2012-11-06 ENCOUNTER — Encounter (HOSPITAL_COMMUNITY): Payer: Self-pay | Admitting: *Deleted

## 2012-11-06 DIAGNOSIS — Z6829 Body mass index (BMI) 29.0-29.9, adult: Secondary | ICD-10-CM

## 2012-11-06 DIAGNOSIS — R112 Nausea with vomiting, unspecified: Secondary | ICD-10-CM

## 2012-11-06 DIAGNOSIS — E785 Hyperlipidemia, unspecified: Secondary | ICD-10-CM | POA: Diagnosis present

## 2012-11-06 DIAGNOSIS — Z885 Allergy status to narcotic agent status: Secondary | ICD-10-CM

## 2012-11-06 DIAGNOSIS — M549 Dorsalgia, unspecified: Secondary | ICD-10-CM

## 2012-11-06 DIAGNOSIS — Z79899 Other long term (current) drug therapy: Secondary | ICD-10-CM

## 2012-11-06 DIAGNOSIS — K759 Inflammatory liver disease, unspecified: Secondary | ICD-10-CM

## 2012-11-06 DIAGNOSIS — F5105 Insomnia due to other mental disorder: Secondary | ICD-10-CM

## 2012-11-06 DIAGNOSIS — F4310 Post-traumatic stress disorder, unspecified: Secondary | ICD-10-CM | POA: Diagnosis present

## 2012-11-06 DIAGNOSIS — R7309 Other abnormal glucose: Secondary | ICD-10-CM | POA: Diagnosis present

## 2012-11-06 DIAGNOSIS — Z720 Tobacco use: Secondary | ICD-10-CM

## 2012-11-06 DIAGNOSIS — F1911 Other psychoactive substance abuse, in remission: Secondary | ICD-10-CM

## 2012-11-06 DIAGNOSIS — E871 Hypo-osmolality and hyponatremia: Secondary | ICD-10-CM

## 2012-11-06 DIAGNOSIS — S53196A Other dislocation of unspecified ulnohumeral joint, initial encounter: Secondary | ICD-10-CM

## 2012-11-06 DIAGNOSIS — Z888 Allergy status to other drugs, medicaments and biological substances status: Secondary | ICD-10-CM

## 2012-11-06 DIAGNOSIS — F319 Bipolar disorder, unspecified: Secondary | ICD-10-CM

## 2012-11-06 DIAGNOSIS — R739 Hyperglycemia, unspecified: Secondary | ICD-10-CM

## 2012-11-06 DIAGNOSIS — Z886 Allergy status to analgesic agent status: Secondary | ICD-10-CM

## 2012-11-06 DIAGNOSIS — B179 Acute viral hepatitis, unspecified: Secondary | ICD-10-CM

## 2012-11-06 DIAGNOSIS — R748 Abnormal levels of other serum enzymes: Secondary | ICD-10-CM

## 2012-11-06 DIAGNOSIS — E669 Obesity, unspecified: Secondary | ICD-10-CM | POA: Diagnosis present

## 2012-11-06 DIAGNOSIS — F1011 Alcohol abuse, in remission: Secondary | ICD-10-CM

## 2012-11-06 DIAGNOSIS — F489 Nonpsychotic mental disorder, unspecified: Secondary | ICD-10-CM | POA: Diagnosis present

## 2012-11-06 DIAGNOSIS — K7200 Acute and subacute hepatic failure without coma: Principal | ICD-10-CM | POA: Diagnosis present

## 2012-11-06 DIAGNOSIS — E7800 Pure hypercholesterolemia, unspecified: Secondary | ICD-10-CM | POA: Diagnosis present

## 2012-11-06 DIAGNOSIS — R17 Unspecified jaundice: Secondary | ICD-10-CM | POA: Diagnosis present

## 2012-11-06 DIAGNOSIS — Z87891 Personal history of nicotine dependence: Secondary | ICD-10-CM

## 2012-11-06 HISTORY — DX: Other psychoactive substance abuse, in remission: F19.11

## 2012-11-06 HISTORY — DX: Acute viral hepatitis, unspecified: B17.9

## 2012-11-06 HISTORY — DX: Alcohol abuse, in remission: F10.11

## 2012-11-06 LAB — CBC WITH DIFFERENTIAL/PLATELET
Basophils Absolute: 0 10*3/uL (ref 0.0–0.1)
Eosinophils Absolute: 0.2 10*3/uL (ref 0.0–0.7)
HCT: 39 % (ref 36.0–46.0)
Lymphocytes Relative: 29 % (ref 12–46)
MCHC: 33.6 g/dL (ref 30.0–36.0)
Monocytes Absolute: 0.8 10*3/uL (ref 0.1–1.0)
Neutro Abs: 4.8 10*3/uL (ref 1.7–7.7)
RDW: 18.8 % — ABNORMAL HIGH (ref 11.5–15.5)

## 2012-11-06 LAB — COMPREHENSIVE METABOLIC PANEL
BUN: 5 mg/dL — ABNORMAL LOW (ref 6–23)
CO2: 22 mEq/L (ref 19–32)
Chloride: 94 mEq/L — ABNORMAL LOW (ref 96–112)
Creatinine, Ser: 0.54 mg/dL (ref 0.50–1.10)
GFR calc Af Amer: >90 mL/min (ref >90)
GFR calc non Af Amer: >90 mL/min (ref >90)
Glucose, Bld: 397 mg/dL — ABNORMAL HIGH (ref 70–99)
Total Bilirubin: 10 mg/dL — ABNORMAL HIGH (ref 0.3–1.2)

## 2012-11-06 LAB — LIPASE, BLOOD: Lipase: 68 U/L — ABNORMAL HIGH (ref 11–59)

## 2012-11-06 LAB — GLUCOSE, CAPILLARY: Glucose-Capillary: 393 mg/dL — ABNORMAL HIGH (ref 70–99)

## 2012-11-06 LAB — URINALYSIS, ROUTINE W REFLEX MICROSCOPIC
Ketones, ur: NEGATIVE mg/dL
Nitrite: NEGATIVE
Protein, ur: NEGATIVE mg/dL
pH: 6 (ref 5.0–8.0)

## 2012-11-06 LAB — URINE MICROSCOPIC-ADD ON

## 2012-11-06 LAB — PROTIME-INR: Prothrombin Time: 13.3 seconds (ref 11.6–15.2)

## 2012-11-06 LAB — RAPID URINE DRUG SCREEN, HOSP PERFORMED: Opiates: NOT DETECTED

## 2012-11-06 MED ORDER — IOHEXOL 300 MG/ML  SOLN: 100.0000 mL | Freq: Once | INTRAMUSCULAR | Status: AC | PRN

## 2012-11-06 MED ORDER — ONDANSETRON HCL 4 MG/2ML IJ SOLN: 4.0000 mg | Freq: Three times a day (TID) | INTRAMUSCULAR | Status: DC | PRN

## 2012-11-06 MED ORDER — SODIUM CHLORIDE 0.9 % IV SOLN
INTRAVENOUS | Status: AC
  Administered 2012-11-07: via INTRAVENOUS

## 2012-11-06 MED ORDER — MORPHINE SULFATE 4 MG/ML IJ SOLN
4.0000 mg | INTRAMUSCULAR | Status: DC | PRN
  Administered 2012-11-06: 4 mg via INTRAVENOUS
  Filled 2012-11-06: qty 1

## 2012-11-06 MED ORDER — SODIUM CHLORIDE 0.9 % IV BOLUS (SEPSIS)
500.0000 mL | Freq: Once | INTRAVENOUS | Status: AC
  Administered 2012-11-06: 500 mL via INTRAVENOUS

## 2012-11-06 MED ORDER — IOHEXOL 300 MG/ML  SOLN
100.0000 mL | Freq: Once | INTRAMUSCULAR | Status: AC | PRN
  Administered 2012-11-06: 100 mL via INTRAVENOUS

## 2012-11-06 MED ORDER — ONDANSETRON HCL 4 MG/2ML IJ SOLN
4.0000 mg | INTRAMUSCULAR | Status: DC | PRN
  Administered 2012-11-06: 4 mg via INTRAVENOUS
  Filled 2012-11-06: qty 2

## 2012-11-06 MED ORDER — IOHEXOL 300 MG/ML  SOLN
100.0000 mL | Freq: Once | INTRAMUSCULAR | Status: AC | PRN
  Administered 2012-11-06: 50 mL via INTRAVENOUS

## 2012-11-06 MED ORDER — SODIUM CHLORIDE 0.9 % IV SOLN
INTRAVENOUS | Status: DC
  Administered 2012-11-06: 1000 mL via INTRAVENOUS

## 2012-11-06 NOTE — ED Notes (Signed)
Weak, n/v, chills eyes jaundiced.  Removed ticks 2 weeks ago
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
## 2012-11-06 NOTE — ED Notes (Signed)
Pt states been sick for about 3 weeks. Pt has not seen PCP because she is changing PCP. States feels tired & not wanting to do anything.

## 2012-11-06 NOTE — ED Notes (Signed)
Pt provided another cup of ice chips, no vomiting since arriving in the ED.

## 2012-11-06 NOTE — ED Notes (Signed)
Pt ambulated to restroom & returned to room w/ no complications. 
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
## 2012-11-07 ENCOUNTER — Encounter (HOSPITAL_COMMUNITY): Payer: Self-pay | Admitting: Internal Medicine

## 2012-11-07 DIAGNOSIS — F319 Bipolar disorder, unspecified: Secondary | ICD-10-CM

## 2012-11-07 DIAGNOSIS — K759 Inflammatory liver disease, unspecified: Secondary | ICD-10-CM | POA: Diagnosis present

## 2012-11-07 DIAGNOSIS — B179 Acute viral hepatitis, unspecified: Secondary | ICD-10-CM | POA: Diagnosis present

## 2012-11-07 DIAGNOSIS — R748 Abnormal levels of other serum enzymes: Secondary | ICD-10-CM | POA: Diagnosis present

## 2012-11-07 DIAGNOSIS — R739 Hyperglycemia, unspecified: Secondary | ICD-10-CM | POA: Diagnosis present

## 2012-11-07 DIAGNOSIS — F1011 Alcohol abuse, in remission: Secondary | ICD-10-CM

## 2012-11-07 DIAGNOSIS — F17200 Nicotine dependence, unspecified, uncomplicated: Secondary | ICD-10-CM

## 2012-11-07 DIAGNOSIS — R17 Unspecified jaundice: Secondary | ICD-10-CM

## 2012-11-07 DIAGNOSIS — K7200 Acute and subacute hepatic failure without coma: Principal | ICD-10-CM

## 2012-11-07 DIAGNOSIS — Z720 Tobacco use: Secondary | ICD-10-CM | POA: Diagnosis present

## 2012-11-07 DIAGNOSIS — E871 Hypo-osmolality and hyponatremia: Secondary | ICD-10-CM | POA: Diagnosis present

## 2012-11-07 HISTORY — DX: Acute viral hepatitis, unspecified: B17.9

## 2012-11-07 LAB — CBC
HCT: 37.7 % (ref 36.0–46.0)
MCH: 26.2 pg (ref 26.0–34.0)
MCHC: 33.4 g/dL (ref 30.0–36.0)
MCV: 78.4 fL (ref 78.0–100.0)
RDW: 18.7 % — ABNORMAL HIGH (ref 11.5–15.5)
WBC: 8.3 10*3/uL (ref 4.0–10.5)

## 2012-11-07 LAB — GLUCOSE, CAPILLARY
Glucose-Capillary: 85 mg/dL (ref 70–99)
Glucose-Capillary: 129 mg/dL — ABNORMAL HIGH (ref 70–99)
Glucose-Capillary: 124 mg/dL — ABNORMAL HIGH (ref 70–99)
Glucose-Capillary: 86 mg/dL (ref 70–99)

## 2012-11-07 LAB — HIV ANTIBODY (ROUTINE TESTING W REFLEX): HIV: NONREACTIVE

## 2012-11-07 LAB — COMPREHENSIVE METABOLIC PANEL
ALT: 1019 U/L — ABNORMAL HIGH (ref 0–35)
Alkaline Phosphatase: 185 U/L — ABNORMAL HIGH (ref 39–117)
CO2: 24 mEq/L (ref 19–32)
GFR calc Af Amer: >90 mL/min (ref >90)
Glucose, Bld: 128 mg/dL — ABNORMAL HIGH (ref 70–99)
Potassium: 3.6 mEq/L (ref 3.5–5.1)
Sodium: 136 mEq/L (ref 135–145)
Total Protein: 6.4 g/dL (ref 6.0–8.3)

## 2012-11-07 LAB — BILIRUBIN, FRACTIONATED(TOT/DIR/INDIR): Bilirubin, Direct: 8.1 mg/dL — ABNORMAL HIGH (ref 0.0–0.3)

## 2012-11-07 LAB — HEMOGLOBIN A1C
Hgb A1c MFr Bld: 5.5 % (ref <5.7)
Mean Plasma Glucose: 111 mg/dL (ref <117)

## 2012-11-07 LAB — MRSA PCR SCREENING: MRSA by PCR: NEGATIVE

## 2012-11-07 LAB — LIPASE, BLOOD: Lipase: 154 U/L — ABNORMAL HIGH (ref 11–59)

## 2012-11-07 MED ORDER — TRAZODONE HCL 50 MG PO TABS: 25.0000 mg | ORAL_TABLET | Freq: Every evening | ORAL | Status: DC | PRN

## 2012-11-07 MED ORDER — ARIPIPRAZOLE 10 MG PO TABS
20.0000 mg | ORAL_TABLET | Freq: Every day | ORAL | Status: DC
  Administered 2012-11-07 – 2012-11-08 (×2): 20 mg via ORAL
  Filled 2012-11-07 (×3): qty 2

## 2012-11-07 MED ORDER — ONDANSETRON HCL 4 MG/2ML IJ SOLN: 4.0000 mg | Freq: Four times a day (QID) | INTRAMUSCULAR | Status: DC | PRN

## 2012-11-07 MED ORDER — NICOTINE 21 MG/24HR TD PT24
21.0000 mg | MEDICATED_PATCH | Freq: Every day | TRANSDERMAL | Status: DC
  Administered 2012-11-07 – 2012-11-08 (×2): 21 mg via TRANSDERMAL
  Filled 2012-11-07 (×2): qty 1

## 2012-11-07 MED ORDER — INSULIN ASPART 100 UNIT/ML ~~LOC~~ SOLN
0.0000 [IU] | Freq: Three times a day (TID) | SUBCUTANEOUS | Status: DC
  Administered 2012-11-07 – 2012-11-08 (×3): 1 [IU] via SUBCUTANEOUS

## 2012-11-07 MED ORDER — ALPRAZOLAM 1 MG PO TABS
1.0000 mg | ORAL_TABLET | Freq: Three times a day (TID) | ORAL | Status: DC
  Administered 2012-11-07 – 2012-11-08 (×4): 1 mg via ORAL
  Filled 2012-11-07 (×3): qty 1
  Filled 2012-11-07: qty 2

## 2012-11-07 MED ORDER — OXYCODONE HCL 5 MG PO TABS
5.0000 mg | ORAL_TABLET | ORAL | Status: DC | PRN
  Administered 2012-11-07 – 2012-11-08 (×4): 5 mg via ORAL
  Filled 2012-11-07 (×4): qty 1

## 2012-11-07 MED ORDER — INSULIN ASPART 100 UNIT/ML ~~LOC~~ SOLN: 0.0000 [IU] | Freq: Every day | SUBCUTANEOUS | Status: DC

## 2012-11-07 MED ORDER — POTASSIUM CHLORIDE IN NACL 20-0.9 MEQ/L-% IV SOLN
INTRAVENOUS | Status: DC
  Administered 2012-11-07: 01:00:00 via INTRAVENOUS

## 2012-11-07 MED ORDER — BENZTROPINE MESYLATE 1 MG PO TABS
0.5000 mg | ORAL_TABLET | Freq: Two times a day (BID) | ORAL | Status: DC
  Administered 2012-11-07 – 2012-11-08 (×3): 0.5 mg via ORAL
  Filled 2012-11-07 (×3): qty 1

## 2012-11-07 MED ORDER — ONDANSETRON HCL 4 MG PO TABS: 4.0000 mg | ORAL_TABLET | Freq: Four times a day (QID) | ORAL | Status: DC | PRN
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
## 2012-11-07 NOTE — Progress Notes (Signed)
Dr. Karilyn Cota notified of consult.

## 2012-11-07 NOTE — Progress Notes (Signed)
UR Chart Review Completed  

## 2012-11-07 NOTE — Progress Notes (Signed)
Report given to L.Foote,LPN. Patient transferred to 221 in stable condition via wheelchair.

## 2012-11-07 NOTE — Progress Notes (Signed)
Subjective: The patient complains of low back pain which she has chronically. She takes Tylenol and Percocet as needed for pain chronically. She denies nausea, vomiting, or abdominal pain. She also was to go smoke, but I told her she could not. She continues to smoke one pack of cigarettes per day. She has a history of alcoholism, but has been sober for almost one year except for 1 "shot" of alcohol recently. Her boyfriend confirms that she has been virtually alcohol free except for that one shot of alcohol. She also has a history of intravenous drug use and tattoos.  Objective: Vital signs in last 24 hours: Filed Vitals:   11/06/12 1930 11/06/12 2320 11/06/12 2343 11/07/12 0400  BP: 103/71 116/78 112/82   Pulse: 91 88 86   Temp:   97.6 F (36.4 C) 98.7 F (37.1 C)  TempSrc:   Oral Oral  Resp:  16 18   Height:   5\' 3"  (1.6 m)   Weight:   70.1 kg (154 lb 8.7 oz)   SpO2:  98% 95%     Intake/Output Summary (Last 24 hours) at 11/07/12 0810 Last data filed at 11/07/12 0500  Gross per 24 hour  Intake 286.25 ml  Output      0 ml  Net 286.25 ml    Weight change:   Physical exam: General: Pleasant 42 year old Caucasian woman who is sitting up in bed, in no acute distress. Skin: Globally jaundiced. Conjunctivae/sclerae: Scleral icterus bilaterally. Lungs: Clear to auscultation bilaterally Heart: S1, S2, with no murmurs rubs or gallops. Abdomen: Mildly obese, positive bowel sounds, soft, nontender, nondistended. Extreme these: No pedal edema. Neurologic: She is alert and oriented x3. Cranial nerves II through XII are intact. Strength is 5 over 5 throughout. Sensation is intact.   Lab Results: Basic Metabolic Panel:  Recent Labs  40/98/11 1900 11/07/12 0449  NA 126* 136  K 3.6 3.6  CL 94* 102  CO2 22 24  GLUCOSE 397* 128*  BUN 5* 4*  CREATININE 0.54 0.54  CALCIUM 8.5 8.8   Liver Function Tests:  Recent Labs  11/06/12 1900 11/07/12 0115 11/07/12 0449  AST 1137*  --   1335*  ALT 1107*  --  1019*  ALKPHOS 194*  --  185*  BILITOT 10.0* 11.1* 10.2*  PROT 6.8  --  6.4  ALBUMIN 2.6*  --  2.6*    Recent Labs  11/06/12 1900 11/07/12 0500  LIPASE 68* 154*   No results found for this basename: AMMONIA,  in the last 72 hours CBC:  Recent Labs  11/06/12 1900 11/07/12 0449  WBC 8.2 8.3  NEUTROABS 4.8  --   HGB 13.1 12.6  HCT 39.0 37.7  MCV 78.8 78.4  PLT 282 300   Cardiac Enzymes: No results found for this basename: CKTOTAL, CKMB, CKMBINDEX, TROPONINI,  in the last 72 hours BNP: No results found for this basename: PROBNP,  in the last 72 hours D-Dimer: No results found for this basename: DDIMER,  in the last 72 hours CBG:  Recent Labs  11/06/12 1909 11/07/12 0112  GLUCAP 393* 85   Hemoglobin A1C: No results found for this basename: HGBA1C,  in the last 72 hours Fasting Lipid Panel: No results found for this basename: CHOL, HDL, LDLCALC, TRIG, CHOLHDL, LDLDIRECT,  in the last 72 hours Thyroid Function Tests: No results found for this basename: TSH, T4TOTAL, FREET4, T3FREE, THYROIDAB,  in the last 72 hours Anemia Panel: No results found for this basename: VITAMINB12, FOLATE,  FERRITIN, TIBC, IRON, RETICCTPCT,  in the last 72 hours Coagulation:  Recent Labs  11/06/12 1900 11/07/12 0449  LABPROT 13.3 13.7  INR 1.02 1.06   Urine Drug Screen: Drugs of Abuse     Component Value Date/Time   LABOPIA NONE DETECTED 11/06/2012 1911   COCAINSCRNUR NONE DETECTED 11/06/2012 1911   LABBENZ NONE DETECTED 11/06/2012 1911   AMPHETMU NONE DETECTED 11/06/2012 1911   THCU NONE DETECTED 11/06/2012 1911   LABBARB NONE DETECTED 11/06/2012 1911    Alcohol Level:  Recent Labs  11/06/12 1900  ETH <11   Urinalysis:  Recent Labs  11/06/12 1911  COLORURINE YELLOW  LABSPEC 1.015  PHURINE 6.0  GLUCOSEU >1000*  HGBUR NEGATIVE  BILIRUBINUR MODERATE*  KETONESUR NEGATIVE  PROTEINUR NEGATIVE  UROBILINOGEN 0.2  NITRITE NEGATIVE  LEUKOCYTESUR  NEGATIVE   Misc. Labs:   Micro: Recent Results (from the past 240 hour(s))  MRSA PCR SCREENING     Status: None   Collection Time    11/06/12 11:31 PM      Result Value Range Status   MRSA by PCR NEGATIVE  NEGATIVE Final   Comment:            The GeneXpert MRSA Assay (FDA     approved for NASAL specimens     only), is one component of a     comprehensive MRSA colonization     surveillance program. It is not     intended to diagnose MRSA     infection nor to guide or     monitor treatment for     MRSA infections.    Studies/Results: Ct Abdomen Pelvis W Contrast  11/06/2012   *RADIOLOGY REPORT*  Clinical Data: Jaundice.  Vomiting.  Weakness.  CT ABDOMEN AND PELVIS WITH CONTRAST  Technique:  Multidetector CT imaging of the abdomen and pelvis was performed following the standard protocol during bolus administration of intravenous contrast.  Contrast:  OMNIPAQUE IOHEXOL 300 MG/ML  SOLN  Comparison: 01/16/2012  Findings: The liver is normal appearance.  No evidence of liver masses or biliary dilatation.  Gallbladder is unremarkable.  The spleen, pancreas, adrenal glands, and kidneys are normal appearance.  Mild lymphadenopathy is seen in the region of the celiac axis and porta hepatis, with largest lymph node in the portacaval space measuring 1.4 cm. This is slowly more prominent than on previous study, with the largest portacaval lymph node measuring 9 mm, however this is nonspecific.  No adenopathy seen elsewhere within the abdomen or pelvis.  Prior hysterectomy noted.  Adnexal regions are unremarkable. Normal appendix visualized.  No evidence of inflammatory process or abnormal fluid collections.  No evidence of dilated bowel loops.  IMPRESSION:  1.  Nonspecific mild porta hepatis and celiac axis lymphadenopathy. This may be reactive in etiology given the history of jaundice and presumed elevated liver function tests.  Continued follow-up by CT is recommended in 3 months. 2.  No  radiographic abnormality of the liver.  No evidence of liver mass or biliary dilatation.   Original Report Authenticated By: Myles Rosenthal, M.D.    Medications:  Scheduled: . insulin aspart  0-5 Units Subcutaneous QHS  . insulin aspart  0-9 Units Subcutaneous TID WC   Continuous: . 0.9 % NaCl with KCl 20 mEq / L 75 mL/hr at 11/07/12 0500   [id number] (ZOFRAN) IV, ondansetron, oxyCODONE, traZODone  Assessment: Principal Problem:   Acute hepatitis Active Problems:   Bipolar 1 disorder   Insomnia due to mental disorder  Hyponatremia   Hyperglycemia   Jaundice due to hepatitis   Tobacco abuse   History of alcohol abuse   Elevated lipase   1. Acute hepatitis. Exact etiology is unknown. Differential diagnoses include alcoholic hepatitis (although the patient has not drunk much alcohol over the past year), viral hepatitis in the setting of history of IV drug use and tattoos, medications including Zocor, acetaminophen, and Cymbalta, and possibly autoimmune cause. Acute viral hepatitis panel ordered and pending. CT of the abdomen revealed mild adenopathy, no other significant abnormalities.  Elevated lipase. Query acute pancreatitis versus nearby inflammation from hepatitis. She denies abdominal pain.  Hyponatremia. Likely secondary to hypovolemia/mild dehydration. Resolved with IV fluids.  Hyperglycemia. Her venous glucose was nearly 400 on admission, but has virtually normalized. Query stress related versus reactive from hepatitis. Hemoglobin A1c pending.  Bipolar disorder. Currently stable. She is on multiple psychotropic medications.  Tobacco abuse. The patient was advised to stop smoking.  History of IV drug use and history of alcoholism.    Plan:  1. GI consult pending. Will hold off on ordering of her lab tests to assess for hepatitis and will defer to GI. 2. Acute viral hepatitis panel pending. 3. Hemoglobin A1c and TSH are pending. We'll continue carbohydrate  modified diet and sliding scale NovoLog for now. If her hemoglobin A1c is normal and if her blood glucose continues to trend downward, we'll discontinue sliding scale NovoLog. 4. We'll decrease the IV fluids. 5. Tobacco cessation counseling. We'll start nicotine replacement therapy.   LOS: 1 day   Azara Gemme 11/07/2012, 8:10 AM

## 2012-11-07 NOTE — Progress Notes (Signed)
Dr. Sherrie Mustache notified of patients concerns related to bipolar meds. Also asked about continuing CBG checks since note stated otherwise. New orders placed for patients regular medications. Will continue CBG checks at this time according to current order.

## 2012-11-08 ENCOUNTER — Encounter (HOSPITAL_COMMUNITY): Payer: Self-pay | Admitting: Internal Medicine

## 2012-11-08 DIAGNOSIS — M549 Dorsalgia, unspecified: Secondary | ICD-10-CM

## 2012-11-08 DIAGNOSIS — R7309 Other abnormal glucose: Secondary | ICD-10-CM

## 2012-11-08 DIAGNOSIS — E871 Hypo-osmolality and hyponatremia: Secondary | ICD-10-CM

## 2012-11-08 LAB — BASIC METABOLIC PANEL
CO2: 27 mEq/L (ref 19–32)
Calcium: 9.2 mg/dL (ref 8.4–10.5)
Chloride: 99 mEq/L (ref 96–112)
Potassium: 3.8 mEq/L (ref 3.5–5.1)
Sodium: 135 mEq/L (ref 135–145)

## 2012-11-08 LAB — GLUCOSE, CAPILLARY: Glucose-Capillary: 132 mg/dL — ABNORMAL HIGH (ref 70–99)

## 2012-11-08 LAB — URINE CULTURE
Colony Count: NO GROWTH
Culture: NO GROWTH

## 2012-11-08 LAB — HEPATIC FUNCTION PANEL
Albumin: 2.5 g/dL — ABNORMAL LOW (ref 3.5–5.2)
Indirect Bilirubin: 2.8 mg/dL — ABNORMAL HIGH (ref 0.3–0.9)
Total Protein: 6.3 g/dL (ref 6.0–8.3)

## 2012-11-08 LAB — T4, FREE: Free T4: 1.1 ng/dL (ref 0.80–1.80)

## 2012-11-08 LAB — LIPASE, BLOOD: Lipase: 57 U/L (ref 11–59)

## 2012-11-08 MED ORDER — NICOTINE 21 MG/24HR TD PT24: MEDICATED_PATCH | TRANSDERMAL | Status: DC

## 2012-11-08 MED ORDER — OXYCODONE HCL 5 MG PO TABS: 5.0000 mg | ORAL_TABLET | ORAL | Status: DC | PRN

## 2012-11-08 NOTE — Discharge Summary (Signed)
Physician Discharge Summary  Kari Le WUJ:[id number] DOB: August 20, 1970 DOA: 11/06/2012  PCP: No PCP Per Patient  Admit date: 11/06/2012 Discharge date: 11/08/2012  Time spent: Greater than 30 minutes  Recommendations for Outpatient Follow-up:  1. She will followup with Dr. Karilyn Cota per his schedule. She will be called with the appointment.  Discharge Diagnoses:  1. Acute hepatitis with jaundice and hyperbilirubinemia. Etiology unknown. Differential diagnoses include viral hepatitis, history of alcohol abuse, and medication side effect. 2. Elevated lipase without clinical evidence or symptomatology of acute pancreatitis. 3. History of alcohol abuse. Now virtually sober for 10 years. 4. History of intravenous drug use. None in approximately 4 years. 5. History of tattoos. 6. Tobacco abuse. The patient was advised to stop smoking.  7. Hyperglycemia, reactive. Hemoglobin A1c 5.5. 8. Hyponatremia secondary to hypovolemia/mild dehydration. Resolved with IV fluids. 9. Bipolar 1 disorder. Stable.  Discharge Condition: Improved.  Diet recommendation: Regular.  Filed Weights   11/06/12 1837 11/06/12 2343  Weight: 75.297 kg (166 lb) 70.1 kg (154 lb 8.7 oz)    History of present illness:   Kari Le is an 42 y.o. female. Young Caucasian lady with a history of bipolar disorder, developed nausea and vomiting and some diarrhea starting about 3 weeks ago. Associated with malaise, weakness, and chills and right upper quadrant discomfort. Her symptoms resolved. About 1-1/2 weeks ago her fianc pointed out to her that her eyes were very yellow, but she chose to ignore it. She finally came to the emergency department for further evaluation and management. In the emergency department, she was afebrile and hemodynamically stable. Her lab data were significant for a serum sodium of 126, glucose of 397, lipase of 68, AST of 1137, ALT of 1107, and total bilirubin of 10.0. Her acetaminophen level  was less than 15. She was admitted for further evaluation and management.    Hospital Course:    Acute hepatitis. The patient has a history of alcohol abuse and IV drug use, but she had been mostly abstinent from both for several years. In review of her medications, she took Percocet and/or acetaminophen chronically for low back pain. She also took Cymbalta which has listed as one of its side effects, hepatotoxicity. She takes simvastatin for hyperlipidemia as well. She also has tattoos. She is sexually active with her boyfriend. She was started on supportive treatment with IV fluids and as needed analgesics. Cymbalta, simvastatin acetaminophen, and Percocet were all discontinued. CT scan of her abdomen and pelvis revealed mild reactive adenopathy, but no other significant abnormalities. Gastroenterologist, Dr. Karilyn Cota was consulted. He agreed with the medical management and the evaluation/workup. He recommended continued supportive treatment. He did not want to proceed with further investigational studies until the viral markers were completed and resulted. Of note, her HIV serology was negative. The patient remained hemodynamically stable and afebrile. Her liver transaminases and bilirubin did not improve much. She was discharged in stable condition. She will followup with Dr. Karilyn Cota in the outpatient setting for further evaluation and management.  Elevated lipase. The patient did not have symptomatology consistent with acute pancreatitis. The elevated lipase may have been secondary to nearby inflammation from hepatitis. Her lipase normalized prior to discharge.   Hyponatremia. The patient was felt to be dehydrated given her acute illness several weeks ago. She was started on IV fluids with normal saline. Following hydration, her serum sodium normalized.   Hyperglycemia. Her venous glucose was nearly 400 on admission, but she had no history of diabetes. Her capillary  glucose was monitored. Hemoglobin  A1c was ordered. has virtually normalized. It was within normal limits at 5.5. Her venous glucose decreased toward normal.   Bipolar disorder. She was maintained on all of her psychotropic medications with exception of Cymbalta which can potentially cause hepatotoxicity. She was stable.    Tobacco abuse. The patient was advised to stop smoking.   History of IV drug use and history of alcoholism. Now abstinent.  Mildly elevated TSH. Followup TSH with a free T4 in the outpatient setting recommended in the next few months when she is not acutely ill.     Procedures:   None.  Consultations:  Gastroenterologist, Lionel December.  Discharge Exam: Filed Vitals:   11/08/12 0606 11/08/12 0607 11/08/12 0608 11/08/12 1031  BP: 112/73 112/72 107/74 106/77  Pulse: 89 86  89  Temp: 97.9 F (36.6 C)   98 F (36.7 C)  TempSrc: Oral   Oral  Resp: 20   20  Height:      Weight:      SpO2: 95%   94%    General: No acute distress. Cardiovascular: S1, S2, with no murmurs rubs or gallops. Respiratory: Clear to auscultation bilaterally. Abdomen: Positive bowel sounds, soft, nontender, nondistended. Skin: Diffusely jaundiced.    Discharge Instructions  Discharge Orders   Future Orders Complete By Expires     Diet general  As directed     Discharge instructions  As directed     Comments:      Do not take Cymbalta, simvastatin, or acetaminophen (Tylenol) until further indicated. These medications can affect the liver. Do not drink alcohol. Try to stop smoking.    Increase activity slowly  As directed         Medication List    STOP taking these medications       DULoxetine 30 MG capsule  Commonly known as:  CYMBALTA     ibuprofen 200 MG tablet  Commonly known as:  ADVIL,MOTRIN     simvastatin 20 MG tablet  Commonly known as:  ZOCOR      TAKE these medications       acetaminophen 500 MG tablet  Commonly known as:  TYLENOL  Take 1,000 mg by mouth every 6 (six) hours as  needed for pain.     ALPRAZolam 1 MG tablet  Commonly known as:  XANAX  Take 1 tablet (1 mg total) by mouth 3 (three) times daily.     ARIPiprazole 20 MG tablet  Commonly known as:  ABILIFY  Take 1 tablet (20 mg total) by mouth every morning.     benztropine 1 MG tablet  Commonly known as:  COGENTIN  Take 0.5 tablets (0.5 mg total) by mouth 2 (two) times daily. For stiffness Try least dose needed.  CAUSES dry mouth.     methocarbamol 500 MG tablet  Commonly known as:  ROBAXIN  Take 1,000 mg by mouth 4 (four) times daily as needed. 2 po FOUR times a day for spasm/pain     nicotine 21 mg/24hr patch  Commonly known as:  NICODERM CQ - dosed in mg/24 hours  Take as directed on the label.     oxyCODONE 5 MG immediate release tablet  Commonly known as:  Oxy IR/ROXICODONE  Take 1 tablet (5 mg total) by mouth every 4 (four) hours as needed.     traZODone 150 MG tablet  Commonly known as:  DESYREL  Take 150-300 mg by mouth at bedtime as needed for sleep.  Allergies  Allergen Reactions  . Neurontin (Gabapentin) Nausea Only and Rash  . Darvocet (Propoxyphene-Acetaminophen)   . Hydrocodone Itching and Nausea And Vomiting  . Ketorolac Tromethamine     nausea  . Ultram (Tramadol Hcl)     nausea       Follow-up Information   Follow up with REHMAN,NAJEEB U, MD. (His office will call you with your lab results and followup appointment.)    Contact information:   [address], [address] [place] [address] [phone]        The results of significant diagnostics from this hospitalization (including imaging, microbiology, ancillary and laboratory) are listed below for reference.    Significant Diagnostic Studies: Ct Abdomen Pelvis W Contrast  11/06/2012   *RADIOLOGY REPORT*  Clinical Data: Jaundice.  Vomiting.  Weakness.  CT ABDOMEN AND PELVIS WITH CONTRAST  Technique:  Multidetector CT imaging of the abdomen and pelvis was performed following the standard protocol  during bolus administration of intravenous contrast.  Contrast:  OMNIPAQUE IOHEXOL 300 MG/ML  SOLN  Comparison: 01/16/2012  Findings: The liver is normal appearance.  No evidence of liver masses or biliary dilatation.  Gallbladder is unremarkable.  The spleen, pancreas, adrenal glands, and kidneys are normal appearance.  Mild lymphadenopathy is seen in the region of the celiac axis and porta hepatis, with largest lymph node in the portacaval space measuring 1.4 cm. This is slowly more prominent than on previous study, with the largest portacaval lymph node measuring 9 mm, however this is nonspecific.  No adenopathy seen elsewhere within the abdomen or pelvis.  Prior hysterectomy noted.  Adnexal regions are unremarkable. Normal appendix visualized.  No evidence of inflammatory process or abnormal fluid collections.  No evidence of dilated bowel loops.  IMPRESSION:  1.  Nonspecific mild porta hepatis and celiac axis lymphadenopathy. This may be reactive in etiology given the history of jaundice and presumed elevated liver function tests.  Continued follow-up by CT is recommended in 3 months. 2.  No radiographic abnormality of the liver.  No evidence of liver mass or biliary dilatation.   Original Report Authenticated By: Myles Rosenthal, M.D.    Microbiology: Recent Results (from the past 240 hour(s))  URINE CULTURE     Status: None   Collection Time    11/06/12  7:11 PM      Result Value Range Status   Specimen Description URINE, CLEAN CATCH   Final   Special Requests NONE   Final   Culture  Setup Time 11/06/2012 20:00   Final   Colony Count NO GROWTH   Final   Culture NO GROWTH   Final   Report Status 11/08/2012 FINAL   Final  MRSA PCR SCREENING     Status: None   Collection Time    11/06/12 11:31 PM      Result Value Range Status   MRSA by PCR NEGATIVE  NEGATIVE Final   Comment:            The GeneXpert MRSA Assay (FDA     approved for NASAL specimens     only), is one component of a      comprehensive MRSA colonization     surveillance program. It is not     intended to diagnose MRSA     infection nor to guide or     monitor treatment for     MRSA infections.     Labs: Basic Metabolic Panel:  Recent Labs Lab 11/06/12 1900 11/07/12  0449 11/08/12 0503  NA 126* 136 135  K 3.6 3.6 3.8  CL 94* 102 99  CO2 22 24 27   GLUCOSE 397* 128* 144*  BUN 5* 4* 5*  CREATININE 0.54 0.54 0.62  CALCIUM 8.5 8.8 9.2   Liver Function Tests:  Recent Labs Lab 11/06/12 1900 11/07/12 0115 11/07/12 0449 11/08/12 0503  AST 1137*  --  1335* 1487*  ALT 1107*  --  1019* 1007*  ALKPHOS 194*  --  185* 176*  BILITOT 10.0* 11.1* 10.2* 10.6*  PROT 6.8  --  6.4 6.3  ALBUMIN 2.6*  --  2.6* 2.5*    Recent Labs Lab 11/06/12 1900 11/07/12 0500 11/08/12 0949  LIPASE 68* 154* 57   No results found for this basename: AMMONIA,  in the last 168 hours CBC:  Recent Labs Lab 11/06/12 1900 11/07/12 0449  WBC 8.2 8.3  NEUTROABS 4.8  --   HGB 13.1 12.6  HCT 39.0 37.7  MCV 78.8 78.4  PLT 282 300   Cardiac Enzymes: No results found for this basename: CKTOTAL, CKMB, CKMBINDEX, TROPONINI,  in the last 168 hours BNP: BNP (last 3 results) No results found for this basename: PROBNP,  in the last 8760 hours CBG:  Recent Labs Lab 11/07/12 0718 11/07/12 1117 11/07/12 1635 11/08/12 0727 11/08/12 1203  GLUCAP 129* 124* 86 132* 122*       Signed:  Mysti Haley  [facility] 11/08/2012, 4:29 PM

## 2012-11-08 NOTE — Progress Notes (Signed)
Patient given d/c instructions and prescriptions. IV cath intact and removed. No pain/swelling at site.

## 2012-11-08 NOTE — Progress Notes (Addendum)
Patient has no complaints. She is tolerating her diet and drinking fluids. She is anxious to go home. viral markers for hepatitis are still pending Bilirubin is 10.6 AP 176 AST 1487 ALT 1007 Albumin 2.5. Assessment; Acute icteric hepatitis. Transaminases remain elevated; No evidence of encephalopathy. Need for further evaluation will depend on results of viral markers. Since patient is tolerating diet she can go home. Patient advised not to take simvastatin and Simply withholding Cymbalta. I will contact patient with results of pending studies and arrange for followup visit.

## 2012-11-09 ENCOUNTER — Telehealth (INDEPENDENT_AMBULATORY_CARE_PROVIDER_SITE_OTHER): Payer: Self-pay | Admitting: *Deleted

## 2012-11-09 DIAGNOSIS — B169 Acute hepatitis B without delta-agent and without hepatic coma: Secondary | ICD-10-CM

## 2012-11-09 NOTE — Telephone Encounter (Signed)
Per Dr.Rehman the patient will need to have labs drawn. Per Dr.Rehman the patient needs to brought into office Wednesday or Thursday to be seen by Terri. Dewayne Hatch is following up on the appointment

## 2012-11-09 NOTE — Telephone Encounter (Signed)
OV sch'd 11/15/12 at 400 (345), patient aware

## 2012-11-13 ENCOUNTER — Other Ambulatory Visit (INDEPENDENT_AMBULATORY_CARE_PROVIDER_SITE_OTHER): Payer: Self-pay | Admitting: Internal Medicine

## 2012-11-13 ENCOUNTER — Telehealth (HOSPITAL_COMMUNITY): Payer: Self-pay | Admitting: Psychiatry

## 2012-11-13 DIAGNOSIS — F5105 Insomnia due to other mental disorder: Secondary | ICD-10-CM

## 2012-11-13 DIAGNOSIS — F419 Anxiety disorder, unspecified: Secondary | ICD-10-CM

## 2012-11-13 DIAGNOSIS — F319 Bipolar disorder, unspecified: Secondary | ICD-10-CM

## 2012-11-13 LAB — HEPATIC FUNCTION PANEL
ALT: 325 U/L — ABNORMAL HIGH (ref 0–35)
AST: 306 U/L — ABNORMAL HIGH (ref 0–37)
Bilirubin, Direct: 2.8 mg/dL — ABNORMAL HIGH (ref 0.0–0.3)
Total Protein: 7.6 g/dL (ref 6.0–8.3)

## 2012-11-13 LAB — PROTIME-INR: Prothrombin Time: 12.1 seconds (ref 11.6–15.2)

## 2012-11-13 MED ORDER — ARIPIPRAZOLE 20 MG PO TABS: 20.0000 mg | ORAL_TABLET | Freq: Every morning | ORAL | Status: DC

## 2012-11-13 MED ORDER — ALPRAZOLAM 1 MG PO TABS: 1.0000 mg | ORAL_TABLET | Freq: Three times a day (TID) | ORAL | Status: DC

## 2012-11-13 MED ORDER — TRAZODONE HCL 150 MG PO TABS: 150.0000 mg | ORAL_TABLET | Freq: Every evening | ORAL | Status: DC | PRN

## 2012-11-13 MED ORDER — BENZTROPINE MESYLATE 1 MG PO TABS: 0.5000 mg | ORAL_TABLET | Freq: Two times a day (BID) | ORAL | Status: DC

## 2012-11-13 NOTE — Telephone Encounter (Signed)
Chart reviewed. Apparently no scripts were written in Hospital.  Will renew most for full month supply.  Will renew  Only 2-3 day supply of Trazodone and Xanax.

## 2012-11-14 ENCOUNTER — Telehealth (HOSPITAL_COMMUNITY): Payer: Self-pay | Admitting: Psychiatry

## 2012-11-14 DIAGNOSIS — F419 Anxiety disorder, unspecified: Secondary | ICD-10-CM

## 2012-11-14 MED ORDER — ALPRAZOLAM 1 MG PO TABS: 1.0000 mg | ORAL_TABLET | Freq: Three times a day (TID) | ORAL | Status: DC

## 2012-11-14 NOTE — Telephone Encounter (Signed)
Pt has made appointment for Jun 23 will prescribe Xanax to make it then

## 2012-11-15 ENCOUNTER — Encounter (INDEPENDENT_AMBULATORY_CARE_PROVIDER_SITE_OTHER): Payer: Self-pay | Admitting: Internal Medicine

## 2012-11-15 ENCOUNTER — Other Ambulatory Visit (INDEPENDENT_AMBULATORY_CARE_PROVIDER_SITE_OTHER): Payer: Self-pay | Admitting: *Deleted

## 2012-11-15 ENCOUNTER — Ambulatory Visit (HOSPITAL_COMMUNITY): Payer: Self-pay | Admitting: Psychiatry

## 2012-11-15 ENCOUNTER — Telehealth (INDEPENDENT_AMBULATORY_CARE_PROVIDER_SITE_OTHER): Payer: Self-pay | Admitting: Internal Medicine

## 2012-11-15 ENCOUNTER — Telehealth (INDEPENDENT_AMBULATORY_CARE_PROVIDER_SITE_OTHER): Payer: Self-pay | Admitting: *Deleted

## 2012-11-15 ENCOUNTER — Encounter (INDEPENDENT_AMBULATORY_CARE_PROVIDER_SITE_OTHER): Payer: Self-pay | Admitting: *Deleted

## 2012-11-15 ENCOUNTER — Ambulatory Visit (INDEPENDENT_AMBULATORY_CARE_PROVIDER_SITE_OTHER): Payer: Medicaid Other | Admitting: Internal Medicine

## 2012-11-15 DIAGNOSIS — B1910 Unspecified viral hepatitis B without hepatic coma: Secondary | ICD-10-CM
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
## 2012-11-15 NOTE — Telephone Encounter (Signed)
Labs are noted and have been faxed to [facility] A letter was sent to the pt as a reminder

## 2012-11-15 NOTE — Telephone Encounter (Signed)
Kari Le, Kari Le will need a Hep Bsurface antigen and a Hep B antibody in 4 weeks.

## 2012-11-15 NOTE — Telephone Encounter (Signed)
Labs are to drawn per Terri in 4 weeks

## 2012-11-15 NOTE — Patient Instructions (Addendum)
Hep B surface antigen and antibody in 4 weeks with liver function test. OV in 1 month

## 2012-12-10 ENCOUNTER — Encounter (HOSPITAL_COMMUNITY): Payer: Self-pay | Admitting: Psychiatry

## 2012-12-10 ENCOUNTER — Ambulatory Visit (INDEPENDENT_AMBULATORY_CARE_PROVIDER_SITE_OTHER): Payer: 59 | Admitting: Psychiatry

## 2012-12-10 VITALS — BP 124/84 | HR 86 | Ht 60.0 in | Wt 151.0 lb

## 2012-12-10 DIAGNOSIS — F1911 Other psychoactive substance abuse, in remission: Secondary | ICD-10-CM

## 2012-12-10 DIAGNOSIS — F5105 Insomnia due to other mental disorder: Secondary | ICD-10-CM

## 2012-12-10 DIAGNOSIS — F419 Anxiety disorder, unspecified: Secondary | ICD-10-CM

## 2012-12-10 DIAGNOSIS — F411 Generalized anxiety disorder: Secondary | ICD-10-CM

## 2012-12-10 DIAGNOSIS — F319 Bipolar disorder, unspecified: Secondary | ICD-10-CM

## 2012-12-10 DIAGNOSIS — Z87891 Personal history of nicotine dependence: Secondary | ICD-10-CM

## 2012-12-10 DIAGNOSIS — M549 Dorsalgia, unspecified: Secondary | ICD-10-CM

## 2012-12-10 DIAGNOSIS — F17200 Nicotine dependence, unspecified, uncomplicated: Secondary | ICD-10-CM

## 2012-12-10 MED ORDER — METHOCARBAMOL 500 MG PO TABS: 1000.0000 mg | ORAL_TABLET | Freq: Four times a day (QID) | ORAL | Status: DC | PRN

## 2012-12-10 MED ORDER — ARIPIPRAZOLE 20 MG PO TABS
20.0000 mg | ORAL_TABLET | Freq: Every morning | ORAL | Status: DC
Start: 2012-12-10 — End: 2013-03-07

## 2012-12-10 MED ORDER — DULOXETINE HCL 60 MG PO CPEP: 60.0000 mg | ORAL_CAPSULE | Freq: Every day | ORAL | Status: DC

## 2012-12-10 MED ORDER — DULOXETINE HCL 30 MG PO CPEP: 30.0000 mg | ORAL_CAPSULE | Freq: Every day | ORAL | Status: DC

## 2012-12-10 MED ORDER — TRAZODONE HCL 150 MG PO TABS: 150.0000 mg | ORAL_TABLET | Freq: Every evening | ORAL | Status: DC | PRN

## 2012-12-10 MED ORDER — BENZTROPINE MESYLATE 1 MG PO TABS: 0.5000 mg | ORAL_TABLET | Freq: Two times a day (BID) | ORAL | Status: DC

## 2012-12-10 MED ORDER — ALPRAZOLAM 1 MG PO TABS: 1.0000 mg | ORAL_TABLET | Freq: Three times a day (TID) | ORAL | Status: DC

## 2012-12-10 NOTE — Patient Instructions (Addendum)
In keeping with [facility]'s "BOLD NEW FUTURE" it would be very healthy for you to  CUT BACK/CUT OUT on sugar and carbohydrates, that means very limited fruits and starchy vegetables and very limited grains, breads  The goal is low GLYCEMIC INDEX.  CUT OUT all wheat, rye, or barley for the GLUTEN in them.  HIGH fat and LOW carbohydrate diet is the KEY.  Eat avocados, eggs, lean meat like grass fed beef and chicken  Nuts and seeds would be good foods as well.   Stevia is an excellent sweetener.  Safe for the brain.   Kari Le is also a good safe sweetener, not the baking blend form of Truvia  Almond butter is awesome.  Check out all this on the Internet.  Kari Le is on the Internet with some good info about this.   http://www.drperlmutter.com is where that is.  An excellent site for info on this diet is http://paleoleap.com  [employer] makes dark chocolate that is sweetened with Stevia that is safe.  Kari Le is a soda sweetened with Stevia and is available at [employer] among other places.  Call if problems or concerns.
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
## 2012-12-13 ENCOUNTER — Ambulatory Visit (INDEPENDENT_AMBULATORY_CARE_PROVIDER_SITE_OTHER): Payer: Self-pay | Admitting: Internal Medicine

## 2012-12-24 ENCOUNTER — Other Ambulatory Visit (INDEPENDENT_AMBULATORY_CARE_PROVIDER_SITE_OTHER): Payer: Self-pay | Admitting: Internal Medicine

## 2012-12-24 ENCOUNTER — Telehealth (INDEPENDENT_AMBULATORY_CARE_PROVIDER_SITE_OTHER): Payer: Self-pay | Admitting: *Deleted

## 2012-12-24 ENCOUNTER — Telehealth (INDEPENDENT_AMBULATORY_CARE_PROVIDER_SITE_OTHER): Payer: Self-pay | Admitting: Internal Medicine

## 2012-12-24 DIAGNOSIS — R768 Other specified abnormal immunological findings in serum: Secondary | ICD-10-CM

## 2012-12-24 NOTE — Telephone Encounter (Signed)
Will add a cmet to her orders.

## 2012-12-24 NOTE — Telephone Encounter (Signed)
She has not had blood work drawn. I talked with her aunt. I did not release any information on this patient. I told her Aunt patient needed to call our office again or go to the ED.

## 2012-12-24 NOTE — Telephone Encounter (Signed)
Kari Le said she is having right side pain, weak and nauseated. She said she has Hep B with an inflamed liver. Her return phone number is [phone].

## 2012-12-24 NOTE — Telephone Encounter (Signed)
none

## 2013-02-13 ENCOUNTER — Emergency Department (HOSPITAL_COMMUNITY)
Admission: EM | Admit: 2013-02-13 | Discharge: 2013-02-14 | Disposition: A | Discharge status: Home / Self Care | Payer: Medicaid Other | Attending: Emergency Medicine | Admitting: Emergency Medicine

## 2013-02-13 ENCOUNTER — Encounter (HOSPITAL_COMMUNITY): Payer: Self-pay | Admitting: Emergency Medicine

## 2013-02-13 DIAGNOSIS — Z79899 Other long term (current) drug therapy: Secondary | ICD-10-CM | POA: Insufficient documentation

## 2013-02-13 DIAGNOSIS — Z8639 Personal history of other endocrine, nutritional and metabolic disease: Secondary | ICD-10-CM | POA: Insufficient documentation

## 2013-02-13 DIAGNOSIS — F17200 Nicotine dependence, unspecified, uncomplicated: Secondary | ICD-10-CM | POA: Insufficient documentation

## 2013-02-13 DIAGNOSIS — R109 Unspecified abdominal pain: Secondary | ICD-10-CM

## 2013-02-13 DIAGNOSIS — R5381 Other malaise: Secondary | ICD-10-CM | POA: Insufficient documentation

## 2013-02-13 DIAGNOSIS — Z90710 Acquired absence of both cervix and uterus: Secondary | ICD-10-CM | POA: Insufficient documentation

## 2013-02-13 DIAGNOSIS — M549 Dorsalgia, unspecified: Secondary | ICD-10-CM | POA: Insufficient documentation

## 2013-02-13 DIAGNOSIS — Z8659 Personal history of other mental and behavioral disorders: Secondary | ICD-10-CM | POA: Insufficient documentation

## 2013-02-13 DIAGNOSIS — Z87828 Personal history of other (healed) physical injury and trauma: Secondary | ICD-10-CM | POA: Insufficient documentation

## 2013-02-13 DIAGNOSIS — Z8719 Personal history of other diseases of the digestive system: Secondary | ICD-10-CM | POA: Insufficient documentation

## 2013-02-13 DIAGNOSIS — R6883 Chills (without fever): Secondary | ICD-10-CM | POA: Insufficient documentation

## 2013-02-13 DIAGNOSIS — F1021 Alcohol dependence, in remission: Secondary | ICD-10-CM | POA: Insufficient documentation

## 2013-02-13 DIAGNOSIS — Z862 Personal history of diseases of the blood and blood-forming organs and certain disorders involving the immune mechanism: Secondary | ICD-10-CM | POA: Insufficient documentation

## 2013-02-13 DIAGNOSIS — F319 Bipolar disorder, unspecified: Secondary | ICD-10-CM | POA: Insufficient documentation

## 2013-02-13 DIAGNOSIS — R1110 Vomiting, unspecified: Secondary | ICD-10-CM | POA: Insufficient documentation

## 2013-02-13 LAB — URINALYSIS, ROUTINE W REFLEX MICROSCOPIC
Glucose, UA: NEGATIVE mg/dL
Leukocytes, UA: NEGATIVE
Protein, ur: NEGATIVE mg/dL
Specific Gravity, Urine: >1.03 — ABNORMAL HIGH (ref 1.005–1.030)

## 2013-02-13 MED ORDER — ONDANSETRON 8 MG PO TBDP
8.0000 mg | ORAL_TABLET | Freq: Once | ORAL | Status: AC
  Administered 2013-02-13: 8 mg via ORAL
  Filled 2013-02-13: qty 1

## 2013-02-13 MED ORDER — OXYCODONE HCL 5 MG PO TABS
5.0000 mg | ORAL_TABLET | Freq: Once | ORAL | Status: AC
  Administered 2013-02-13: 5 mg via ORAL
  Filled 2013-02-13: qty 1
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
## 2013-02-13 NOTE — ED Notes (Signed)
Pt c/o bilateral flank pain, abd pain, and back pain with weakness x one week.

## 2013-02-14 LAB — COMPREHENSIVE METABOLIC PANEL
ALT: 16 U/L (ref 0–35)
AST: 20 U/L (ref 0–37)
Alkaline Phosphatase: 52 U/L (ref 39–117)
CO2: 30 mEq/L (ref 19–32)
Calcium: 9.8 mg/dL (ref 8.4–10.5)
GFR calc non Af Amer: >90 mL/min (ref >90)
Potassium: 3.8 mEq/L (ref 3.5–5.1)
Sodium: 138 mEq/L (ref 135–145)
Total Protein: 7.5 g/dL (ref 6.0–8.3)

## 2013-02-14 LAB — CBC WITH DIFFERENTIAL/PLATELET
Basophils Relative: 1 % (ref 0–1)
Eosinophils Absolute: 0.2 10*3/uL (ref 0.0–0.7)
Eosinophils Relative: 2 % (ref 0–5)
Hemoglobin: 15.6 g/dL — ABNORMAL HIGH (ref 12.0–15.0)
Lymphs Abs: 3.8 10*3/uL (ref 0.7–4.0)
MCH: 27.4 pg (ref 26.0–34.0)
MCHC: 33.8 g/dL (ref 30.0–36.0)
MCV: 81.2 fL (ref 78.0–100.0)
Monocytes Relative: 8 % (ref 3–12)
RBC: 5.69 MIL/uL — ABNORMAL HIGH (ref 3.87–5.11)

## 2013-02-14 MED ORDER — OXYCODONE HCL 5 MG PO TABS
5.0000 mg | ORAL_TABLET | Freq: Once | ORAL | Status: AC
  Administered 2013-02-14: 5 mg via ORAL
  Filled 2013-02-14: qty 1

## 2013-02-19 ENCOUNTER — Ambulatory Visit (HOSPITAL_COMMUNITY): Payer: Self-pay | Admitting: Psychiatry

## 2013-03-07 ENCOUNTER — Encounter (HOSPITAL_COMMUNITY): Payer: Self-pay | Admitting: Psychiatry

## 2013-03-07 ENCOUNTER — Ambulatory Visit (INDEPENDENT_AMBULATORY_CARE_PROVIDER_SITE_OTHER): Payer: 59 | Admitting: Psychiatry

## 2013-03-07 VITALS — BP 140/90 | Ht 60.0 in | Wt 154.0 lb

## 2013-03-07 DIAGNOSIS — F17200 Nicotine dependence, unspecified, uncomplicated: Secondary | ICD-10-CM

## 2013-03-07 DIAGNOSIS — F411 Generalized anxiety disorder: Secondary | ICD-10-CM

## 2013-03-07 DIAGNOSIS — F419 Anxiety disorder, unspecified: Secondary | ICD-10-CM

## 2013-03-07 DIAGNOSIS — F5105 Insomnia due to other mental disorder: Secondary | ICD-10-CM

## 2013-03-07 DIAGNOSIS — F319 Bipolar disorder, unspecified: Secondary | ICD-10-CM

## 2013-03-07 MED ORDER — TRAZODONE HCL 150 MG PO TABS: 150.0000 mg | ORAL_TABLET | Freq: Every evening | ORAL | Status: DC | PRN

## 2013-03-07 MED ORDER — BENZTROPINE MESYLATE 1 MG PO TABS
0.5000 mg | ORAL_TABLET | Freq: Two times a day (BID) | ORAL | Status: DC
Start: 2013-03-07 — End: 2013-06-06

## 2013-03-07 MED ORDER — ARIPIPRAZOLE 20 MG PO TABS: 20.0000 mg | ORAL_TABLET | Freq: Every day | ORAL | Status: DC

## 2013-03-07 MED ORDER — ALPRAZOLAM 1 MG PO TABS: 1.0000 mg | ORAL_TABLET | Freq: Three times a day (TID) | ORAL | Status: DC
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
## 2013-06-06 ENCOUNTER — Ambulatory Visit (INDEPENDENT_AMBULATORY_CARE_PROVIDER_SITE_OTHER): Payer: 59 | Admitting: Psychiatry

## 2013-06-06 ENCOUNTER — Encounter (HOSPITAL_COMMUNITY): Payer: Self-pay | Admitting: Psychiatry

## 2013-06-06 VITALS — BP 100/80 | Ht 66.0 in | Wt 150.0 lb

## 2013-06-06 DIAGNOSIS — F419 Anxiety disorder, unspecified: Secondary | ICD-10-CM

## 2013-06-06 DIAGNOSIS — F411 Generalized anxiety disorder: Secondary | ICD-10-CM

## 2013-06-06 DIAGNOSIS — F319 Bipolar disorder, unspecified: Secondary | ICD-10-CM

## 2013-06-06 DIAGNOSIS — F5105 Insomnia due to other mental disorder: Secondary | ICD-10-CM

## 2013-06-06 DIAGNOSIS — F17200 Nicotine dependence, unspecified, uncomplicated: Secondary | ICD-10-CM

## 2013-06-06 MED ORDER — ALPRAZOLAM 1 MG PO TABS: 1.0000 mg | ORAL_TABLET | Freq: Three times a day (TID) | ORAL | Status: DC

## 2013-06-06 MED ORDER — TRAZODONE HCL 150 MG PO TABS: 150.0000 mg | ORAL_TABLET | Freq: Every evening | ORAL | Status: DC | PRN

## 2013-06-06 MED ORDER — ARIPIPRAZOLE 20 MG PO TABS: 20.0000 mg | ORAL_TABLET | Freq: Every day | ORAL | Status: DC

## 2013-06-06 MED ORDER — BENZTROPINE MESYLATE 1 MG PO TABS: 0.5000 mg | ORAL_TABLET | Freq: Two times a day (BID) | ORAL | Status: DC
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
## 2013-07-08 ENCOUNTER — Emergency Department (HOSPITAL_COMMUNITY)
Admission: EM | Admit: 2013-07-08 | Discharge: 2013-07-09 | Disposition: A | Discharge status: Home / Self Care | Payer: Medicaid Other | Attending: Emergency Medicine | Admitting: Emergency Medicine

## 2013-07-08 ENCOUNTER — Encounter (HOSPITAL_COMMUNITY): Payer: Self-pay | Admitting: Emergency Medicine

## 2013-07-08 DIAGNOSIS — Z3202 Encounter for pregnancy test, result negative: Secondary | ICD-10-CM | POA: Insufficient documentation

## 2013-07-08 DIAGNOSIS — R109 Unspecified abdominal pain: Secondary | ICD-10-CM | POA: Insufficient documentation

## 2013-07-08 DIAGNOSIS — E785 Hyperlipidemia, unspecified: Secondary | ICD-10-CM | POA: Insufficient documentation

## 2013-07-08 DIAGNOSIS — F4310 Post-traumatic stress disorder, unspecified: Secondary | ICD-10-CM | POA: Insufficient documentation

## 2013-07-08 DIAGNOSIS — F319 Bipolar disorder, unspecified: Secondary | ICD-10-CM | POA: Insufficient documentation

## 2013-07-08 DIAGNOSIS — E7800 Pure hypercholesterolemia, unspecified: Secondary | ICD-10-CM | POA: Insufficient documentation

## 2013-07-08 DIAGNOSIS — F17200 Nicotine dependence, unspecified, uncomplicated: Secondary | ICD-10-CM | POA: Insufficient documentation

## 2013-07-08 DIAGNOSIS — Z79899 Other long term (current) drug therapy: Secondary | ICD-10-CM | POA: Insufficient documentation

## 2013-07-08 DIAGNOSIS — Z8619 Personal history of other infectious and parasitic diseases: Secondary | ICD-10-CM | POA: Insufficient documentation

## 2013-07-08 NOTE — ED Notes (Signed)
Patient complaining of right-sided abdominal pain x 4 days. Reports history of Hepatitis B.

## 2013-07-09 LAB — COMPREHENSIVE METABOLIC PANEL
ALT: 15 U/L (ref 0–35)
AST: 21 U/L (ref 0–37)
Albumin: 4.2 g/dL (ref 3.5–5.2)
Alkaline Phosphatase: 63 U/L (ref 39–117)
BUN: 13 mg/dL (ref 6–23)
CO2: 27 mEq/L (ref 19–32)
Calcium: 10.5 mg/dL (ref 8.4–10.5)
Chloride: 93 mEq/L — ABNORMAL LOW (ref 96–112)
Creatinine, Ser: 0.92 mg/dL (ref 0.50–1.10)
GFR calc Af Amer: 89 mL/min — ABNORMAL LOW (ref >90)
GFR calc non Af Amer: 77 mL/min — ABNORMAL LOW (ref >90)
Glucose, Bld: 114 mg/dL — ABNORMAL HIGH (ref 70–99)
Potassium: 4.3 mEq/L (ref 3.7–5.3)
Sodium: 134 mEq/L — ABNORMAL LOW (ref 137–147)
Total Bilirubin: 0.2 mg/dL — ABNORMAL LOW (ref 0.3–1.2)
Total Protein: 8.4 g/dL — ABNORMAL HIGH (ref 6.0–8.3)

## 2013-07-09 LAB — LIPASE, BLOOD: Lipase: 45 U/L (ref 11–59)

## 2013-07-09 LAB — URINE MICROSCOPIC-ADD ON

## 2013-07-09 LAB — CBC WITH DIFFERENTIAL/PLATELET
Basophils Absolute: 0 10*3/uL (ref 0.0–0.1)
Basophils Relative: 0 % (ref 0–1)
Eosinophils Absolute: 0.2 10*3/uL (ref 0.0–0.7)
Eosinophils Relative: 2 % (ref 0–5)
HCT: 47.8 % — ABNORMAL HIGH (ref 36.0–46.0)
Hemoglobin: 16.3 g/dL — ABNORMAL HIGH (ref 12.0–15.0)
Lymphocytes Relative: 41 % (ref 12–46)
Lymphs Abs: 4.1 10*3/uL — ABNORMAL HIGH (ref 0.7–4.0)
MCH: 27.7 pg (ref 26.0–34.0)
MCHC: 34.1 g/dL (ref 30.0–36.0)
MCV: 81.3 fL (ref 78.0–100.0)
Monocytes Absolute: 0.7 10*3/uL (ref 0.1–1.0)
Monocytes Relative: 7 % (ref 3–12)
Neutro Abs: 5 10*3/uL (ref 1.7–7.7)
Neutrophils Relative %: 50 % (ref 43–77)
Platelets: 264 10*3/uL (ref 150–400)
RBC: 5.88 MIL/uL — ABNORMAL HIGH (ref 3.87–5.11)
RDW: 12.9 % (ref 11.5–15.5)
WBC: 10 10*3/uL (ref 4.0–10.5)

## 2013-07-09 LAB — URINALYSIS, ROUTINE W REFLEX MICROSCOPIC
Bilirubin Urine: NEGATIVE
Glucose, UA: NEGATIVE mg/dL
Ketones, ur: NEGATIVE mg/dL
Nitrite: NEGATIVE
Protein, ur: NEGATIVE mg/dL
Specific Gravity, Urine: >1.03 — ABNORMAL HIGH (ref 1.005–1.030)
Urobilinogen, UA: 0.2 mg/dL (ref 0.0–1.0)
pH: 6 (ref 5.0–8.0)

## 2013-07-09 LAB — PREGNANCY, URINE: Preg Test, Ur: NEGATIVE

## 2013-07-09 MED ORDER — OXYCODONE-ACETAMINOPHEN 5-325 MG PO TABS: 1.0000 | ORAL_TABLET | ORAL | Status: DC | PRN

## 2013-07-09 MED ORDER — DEXTROSE 5 % IV SOLN
1.0000 g | INTRAVENOUS | Status: DC
  Administered 2013-07-09: 1 g via INTRAVENOUS
  Filled 2013-07-09: qty 10

## 2013-07-09 MED ORDER — MORPHINE SULFATE 4 MG/ML IJ SOLN
6.0000 mg | Freq: Once | INTRAMUSCULAR | Status: AC
  Administered 2013-07-09: 6 mg via INTRAVENOUS
  Filled 2013-07-09: qty 2

## 2013-07-09 MED ORDER — CEPHALEXIN 500 MG PO CAPS: 500.0000 mg | ORAL_CAPSULE | Freq: Four times a day (QID) | ORAL | Status: DC

## 2013-07-09 MED ORDER — ONDANSETRON HCL 4 MG/2ML IJ SOLN
4.0000 mg | Freq: Once | INTRAMUSCULAR | Status: AC
  Administered 2013-07-09: 4 mg via INTRAMUSCULAR
  Filled 2013-07-09: qty 2

## 2013-07-09 MED ORDER — SODIUM CHLORIDE 0.9 % IV BOLUS (SEPSIS)
1000.0000 mL | Freq: Once | INTRAVENOUS | Status: AC
  Administered 2013-07-09: 1000 mL via INTRAVENOUS

## 2013-07-09 NOTE — Discharge Instructions (Signed)
Flank Pain °Flank pain refers to pain that is located on the side of the body between the upper abdomen and the back. The pain may occur over a short period of time (acute) or may be long-term or reoccurring (chronic). It may be mild or severe. Flank pain can be caused by many things. °CAUSES  °Some of the more common causes of flank pain include: °· Muscle strains.   °· Muscle spasms.   °· A disease of your spine (vertebral disk disease).   °· A lung infection (pneumonia).   °· Fluid around your lungs (pulmonary edema).   °· A kidney infection.   °· Kidney stones.   °· A very painful skin rash caused by the chickenpox virus (shingles).   °· Gallbladder disease.   °HOME CARE INSTRUCTIONS  °Home care will depend on the cause of your pain. In general, °· Rest as directed by your caregiver. °· Drink enough fluids to keep your urine clear or pale yellow. °· Only take over-the-counter or prescription medicines as directed by your caregiver. Some medicines may help relieve the pain. °· Tell your caregiver about any changes in your pain. °· Follow up with your caregiver as directed. °SEEK IMMEDIATE MEDICAL CARE IF:  °· Your pain is not controlled with medicine.   °· You have new or worsening symptoms. °· Your pain increases.   °· You have abdominal pain.   °· You have shortness of breath.   °· You have persistent nausea or vomiting.   °· You have swelling in your abdomen.   °· You feel faint or pass out.   °· You have blood in your urine. °· You have a fever or persistent symptoms for more than 2 3 days. °· You have a fever and your symptoms suddenly get worse. °MAKE SURE YOU:  °· Understand these instructions. °· Will watch your condition. °· Will get help right away if you are not doing well or get worse. °Document Released: 07/28/2005 Document Revised: 02/29/2012 Document Reviewed: 01/19/2012 °ExitCare® Patient Information ©2014 [employer]. ° ° °Emergency Department Resource Guide °1) Find a Doctor and Pay Out of  Pocket °Although you won't have to find out who is covered by your insurance plan, it is a good idea to ask around and get recommendations. You will then need to call the office and see if the doctor you have chosen will accept you as a new patient and what types of options they offer for patients who are self-pay. Some doctors offer discounts or will set up payment plans for their patients who do not have insurance, but you will need to ask so you aren't surprised when you get to your appointment. ° °2) Contact Your Local Health Department °Not all health departments have doctors that can see patients for sick visits, but many do, so it is worth a call to see if yours does. If you don't know where your local health department is, you can check in your phone book. The CDC also has a tool to help you locate your state's health department, and many state websites also have listings of all of their local health departments. ° °3) Find a Walk-in Clinic °If your illness is not likely to be very severe or complicated, you may want to try a walk in clinic. These are popping up all over the country in pharmacies, drugstores, and shopping centers. They're usually staffed by nurse practitioners or physician assistants that have been trained to treat common illnesses and complaints. They're usually fairly quick and inexpensive. However, if you have serious medical issues or chronic   medical problems, these are probably not your best option. ° °No Primary Care Doctor: °- Call [facility] at  832-8000 - they can help you locate a primary care doctor that  accepts your insurance, provides certain services, etc. °- Physician Referral Service- 1-800-533-3463 ° °Chronic Pain Problems: °Organization         Address  Phone   Notes  °[facility]  (336) 297-2271 Patients need to be referred by their primary care doctor.  ° °Medication Assistance: °Organization         Address  Phone   Notes  °[place]  [facility] [address] °[place], [place] [address] (336) 641-8030 --Must be a resident of [place] °-- Must have NO insurance coverage whatsoever (no Medicaid/ Medicare, etc.) °-- The pt. MUST have a primary care doctor that directs their care regularly and follows them in the community °  °[facility]  (866) 331-1348   °[employer]  (888) 892-1162   ° °Agencies that provide inexpensive medical care: °Organization         Address  Phone   Notes  °[facility]  (336) 832-8035   °[facility]    (336) 832-7272   °[facility] [address] °[place], [place] [address] (336) 832-4777   °[facility] [address], °[place] (336) 271-4999   °[facility]    (336) 373-0678   °[facility]    (336) 272-1050   °[facility] ° [address], [place] Phone:  (336) 832-4444, Fax:  (336) 832-4440 Hours of Operation:  9 am - 6 pm, M-F.  Also accepts Medicaid/Medicare and self-pay.  °[facility] ° [address], [place] Phone: (336) 832-3150, Fax: (336) 832-3151. Hours of Operation:  8:30 am - 5:30 pm, M-F.  Also accepts Medicaid and self-pay.  °[facility] [place] [address], [place] Phone: (336) 878-6027   °[facility] [address], [place], [place] (336)723-1848, Ext. 123 Mondays & Thursdays: 7-9 AM.  First 15 patients are seen on a first come, first serve basis. °  ° °Medicaid-accepting [place] Providers: ° °Organization         Address  Phone   Notes  °[facility] [address], [place] (336) 641-2100 Also accepts self-pay patients.  °[facility] [address], [place] ° (336) 856-9996   °[facility] [address], [place] (336) 288-8857   °[facility] [address] [place]  [address], [place] (336) 299-7000   °Veita Bland [address], [place]  ° (336) 373-1557 Only accepts [place] Access Medicaid patients after they have their name applied to their card.  ° °Self-Pay (no insurance) in [place]: ° °Organization         Address  Phone   Notes  °Sickle Cell Patients, [facility] [address], [place] (336) 832-1970   °[facility] [address], [place] (336) 832-4400   °[facility] [place] [facility] ° [address], [place] (336) 992-4800   °[facility]/Dr. Osei-Bonsu ° [address] [place] [address], [place] or [address], [place] (336) 841-8500 Phone number for both [place] and [place] locations is the same.  °[facility] [address], [place] (336) 299-0000   °[facility] [address] [place] [address], [place] or [address] (336) 852-7530 °(336) 878-2260   °[facility] [address], [place] (336) 350-1642, phone; (336) 294-5005, fax Sees patients 1st and 3rd Saturday of every month.  Must not qualify for public or private insurance (i.e. Medicaid, Medicare, [place] Health Choice, Veterans' Benefits) • Household income should be no more than 200% of the poverty level •The clinic cannot treat you if you are pregnant or think you are pregnant • Sexually transmitted diseases are not treated at the clinic.  ° ° °Dental Care: °Organization         Address  Phone  Notes  °[place] [facility] [address], [place] (336) 641-6152 Accepts children up to age 21 who are enrolled in Medicaid or [place] Health Choice; pregnant women with a Medicaid card; and children who have applied for Medicaid or [place] Health Choice, but were declined, whose parents can pay a reduced fee at time of service.  °[facility] [place]  [address], [place]  (336) 641-7733 Accepts children up to age 21 who are enrolled in Medicaid or [place] Health Choice; pregnant women with a Medicaid card; and children who have applied for Medicaid or [place] Health Choice, but were declined, whose parents can pay a reduced fee at time of service.  °[facility] ° [address], [place] (336) 641-4533 Patients are seen by appointment only. Walk-ins are not accepted. [facility] will see patients 18 years of age and older. °Monday - Tuesday (8am-5pm) °Most Wednesdays (8:30-5pm) °$30 per visit, cash only  °[facility] ° [address], [place] (336) 641-4533 Patients are seen by appointment only. Walk-ins are not accepted. [facility] will see patients 18 years of age and older. °One Wednesday Evening (Monthly: Volunteer Based).  $30 per visit, cash only  °[facility]  (919) 537-3737 for adults; Children under age 4, call Graduate Pediatric Dentistry at (919) [phone]. Children aged 4-14, please call (919) [phone] to request a pediatric application. ° Dental services are provided in all areas of dental care including fillings, crowns and bridges, complete and partial dentures, implants, gum treatment, root canals, and extractions. Preventive care is also provided. Treatment is provided to both adults and children. °Patients are selected via a lottery and there is often a waiting list. °  °[facility] [address], °[place] ° (336) 763-8833 [email or web address] °  °[facility] [address], [place], [place] (336)723-1848, Ext. 123 Second and Fourth Thursday of each month, opens at 6:30 AM; Clinic ends at 9 AM.  Patients are seen on a first-come first-served basis, and a limited number are seen during each clinic.  ° °[facility] ° [address], [place], [place] (336) 723-7904   Eligibility Requirements °You must have lived in [place], [place], or [place]  counties for at least the last three months. °  You cannot be eligible for state or federal sponsored healthcare insurance, including Veterans Administration, Medicaid, or Medicare. °  You generally cannot be eligible for healthcare insurance through your employer.  °  How to apply: °Eligibility screenings are held every Tuesday and Wednesday afternoon from 1:00 pm until 4:00 pm. You do not need an appointment for the interview!  °  [facility] [address], [place], [place] 336-631-2330   °[facility]  336-342-8273   °[facility]  336-703-3100   °[facility]  336-570-6415   ° °Behavioral Health Resources in the Community: °Intensive Outpatient Programs °Organization         Address  Phone  Notes  °[facility] [address], [place], [place] 336-878-6098   °[facility] [address], [place], [place] 336-832-9800   °[facility] [address], [place], [place] ° 336-882-2125   °[facility] [address],  °[place], [place] 1-800-853-5163 or 336-641-4981   °Substance Abuse Resources °Organization         Address  Phone  Notes  °[facility]  336-882-2125   °[facility]  336-784-9470   °[facility]  336-285-9073   °[facility]  336-845-3988   °Residential & Outpatient Substance Abuse Program  1-800-659-3381   °Psychological Services °Organization         Address  Phone  Notes  °[facility]  336- 832-9600   °[facility]  336- 378-7881   °[facility] [address], [place] 1-800-853-5163 or 336-641-4981   ° °Mobile Crisis Teams °Organization         Address  Phone  Notes  °[facility], Mobile Crisis Care Unit  1-877-626-1772   °Assertive °[facility] ° [address] [place], [place] 336-834-9664   °Sharon DeEsch [address] °[place]  [place] 336-554-5454   ° °Self-Help/Support Groups °Organization         Address  Phone             Notes  °[facility] - variety of support groups  336- 373-1402 Call for more information  °Narcotics Anonymous (NA), [facility] [address], °[place]  2 meetings at this location  ° °Residential Treatment Programs °Organization         Address  Phone  Notes  °[facility] [address],    °[place]  1-866-801-8205   °[facility] ° [address], [place], [place] 704-293-8524   °[facility] [address], [place] 336-845-3988 Admissions: 8am-3pm M-F  °[facility] [address]    °[place], [place] 336-841-1104   °[facility] [address], [place], [place] 336-379-7146   °[facility] [address]  °[place], [place] 336-285-9073   °[facility] Intensive Outpatient [address], [place], [place] 336-852-3033   °[facility] [address]  °[place], [place] 1-877-615-2722 or 336-784-9470   °[facility] [address] [place], [place] 336-227-7417 Accepts Medicaid  °[facility] [address]  °[place] 1-800-659-3381 Substance Abuse/Addiction Treatment  ° °[place] Behavioral Health Resources °Organization         Address  Phone  Notes  °[facility]  (888) 581-9988   °Julie Brannon, PhD [address] [place], [place]   (336) 349-5553 or (336) 951-0000   °[facility]   [address] °[place], [place] (336) 349-4454   °[facility] [address], [place], [place] (336) 342-8316 Insurance/Medicaid/sponsorship through [facility]  °[facility] [address]                                      [place], [place] (336) 342-8316 Therapy/tele-psych/case  °[facility] [address]  ° [place], [place] (336) 349-2233    °Dr. Arfeen  (336)  349-4544   °[facility] [place]  [employer] [place] [facility] 1) [address], [place] °2) [address], [place] °3)  [address], [place] (336) 349-3220 °(336) 342-7768 ° °(336) 342-8140   °[place] [facility] (336) 342-1394 or (336) 342-3537 (After Hours)    ° ° ° ° °

## 2013-07-10 LAB — URINE CULTURE: Colony Count: 25000
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
## 2013-08-30 ENCOUNTER — Other Ambulatory Visit (HOSPITAL_COMMUNITY): Payer: Self-pay | Admitting: Nurse Practitioner

## 2013-08-30 DIAGNOSIS — Z1231 Encounter for screening mammogram for malignant neoplasm of breast: Secondary | ICD-10-CM

## 2013-09-02 ENCOUNTER — Encounter (HOSPITAL_COMMUNITY): Payer: Self-pay | Admitting: Psychiatry

## 2013-09-02 ENCOUNTER — Ambulatory Visit (INDEPENDENT_AMBULATORY_CARE_PROVIDER_SITE_OTHER): Payer: 59 | Admitting: Psychiatry

## 2013-09-02 VITALS — BP 140/80 | Ht 60.0 in | Wt 156.0 lb

## 2013-09-02 DIAGNOSIS — F419 Anxiety disorder, unspecified: Secondary | ICD-10-CM

## 2013-09-02 DIAGNOSIS — F411 Generalized anxiety disorder: Secondary | ICD-10-CM

## 2013-09-02 DIAGNOSIS — F319 Bipolar disorder, unspecified: Secondary | ICD-10-CM

## 2013-09-02 MED ORDER — ALPRAZOLAM 1 MG PO TABS: 1.0000 mg | ORAL_TABLET | Freq: Three times a day (TID) | ORAL | Status: DC

## 2013-09-02 MED ORDER — BENZTROPINE MESYLATE 1 MG PO TABS
0.5000 mg | ORAL_TABLET | Freq: Two times a day (BID) | ORAL | Status: DC
Start: 2013-09-02 — End: 2013-11-26

## 2013-09-02 MED ORDER — ARIPIPRAZOLE 20 MG PO TABS: 20.0000 mg | ORAL_TABLET | Freq: Every day | ORAL | Status: DC

## 2013-09-02 MED ORDER — MIRTAZAPINE 30 MG PO TABS: 30.0000 mg | ORAL_TABLET | Freq: Every day | ORAL | Status: DC
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
## 2013-09-03 ENCOUNTER — Ambulatory Visit (HOSPITAL_COMMUNITY): Payer: Self-pay

## 2013-10-17 ENCOUNTER — Other Ambulatory Visit (HOSPITAL_COMMUNITY): Payer: Self-pay | Admitting: Family Medicine

## 2013-10-17 DIAGNOSIS — Z1231 Encounter for screening mammogram for malignant neoplasm of breast: Secondary | ICD-10-CM

## 2013-10-26 ENCOUNTER — Encounter (HOSPITAL_COMMUNITY): Payer: Self-pay | Admitting: Emergency Medicine

## 2013-10-26 ENCOUNTER — Emergency Department (HOSPITAL_COMMUNITY)
Admission: EM | Admit: 2013-10-26 | Discharge: 2013-10-26 | Disposition: A | Discharge status: Home / Self Care | Payer: Medicaid Other | Attending: Emergency Medicine | Admitting: Emergency Medicine

## 2013-10-26 DIAGNOSIS — M6580 Other synovitis and tenosynovitis, unspecified site: Secondary | ICD-10-CM | POA: Insufficient documentation

## 2013-10-26 DIAGNOSIS — Z8719 Personal history of other diseases of the digestive system: Secondary | ICD-10-CM | POA: Insufficient documentation

## 2013-10-26 DIAGNOSIS — F4310 Post-traumatic stress disorder, unspecified: Secondary | ICD-10-CM | POA: Insufficient documentation

## 2013-10-26 DIAGNOSIS — E785 Hyperlipidemia, unspecified: Secondary | ICD-10-CM | POA: Insufficient documentation

## 2013-10-26 DIAGNOSIS — Z792 Long term (current) use of antibiotics: Secondary | ICD-10-CM | POA: Insufficient documentation

## 2013-10-26 DIAGNOSIS — E7800 Pure hypercholesterolemia, unspecified: Secondary | ICD-10-CM | POA: Insufficient documentation

## 2013-10-26 DIAGNOSIS — Z79899 Other long term (current) drug therapy: Secondary | ICD-10-CM | POA: Insufficient documentation

## 2013-10-26 DIAGNOSIS — G8929 Other chronic pain: Secondary | ICD-10-CM | POA: Insufficient documentation

## 2013-10-26 DIAGNOSIS — F17200 Nicotine dependence, unspecified, uncomplicated: Secondary | ICD-10-CM | POA: Insufficient documentation

## 2013-10-26 DIAGNOSIS — F319 Bipolar disorder, unspecified: Secondary | ICD-10-CM | POA: Insufficient documentation

## 2013-10-26 DIAGNOSIS — M778 Other enthesopathies, not elsewhere classified: Secondary | ICD-10-CM

## 2013-10-26 MED ORDER — OXYCODONE-ACETAMINOPHEN 5-325 MG PO TABS: 1.0000 | ORAL_TABLET | Freq: Four times a day (QID) | ORAL | Status: DC | PRN

## 2013-10-26 MED ORDER — MELOXICAM 15 MG PO TABS: ORAL_TABLET | ORAL | Status: DC

## 2013-10-26 NOTE — ED Notes (Signed)
Pt fell 2 yrs ago and was told she chipped a bone in her elbow. Pt currently on voltaren and ibuprofen with no relief.
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
## 2013-10-26 NOTE — Discharge Instructions (Signed)
Tendinitis °Tendinitis is swelling and inflammation of the tendons. Tendons are band-like tissues that connect muscle to bone. Tendinitis commonly occurs in the:  °· Shoulders (rotator cuff). °· Heels (Achilles tendon). °· Elbows (triceps tendon). °CAUSES °Tendinitis is usually caused by overusing the tendon, muscles, and joints involved. When the tissue surrounding a tendon (synovium) becomes inflamed, it is called tenosynovitis. Tendinitis commonly develops in people whose jobs require repetitive motions. °SYMPTOMS °· Pain. °· Tenderness. °· Mild swelling. °DIAGNOSIS °Tendinitis is usually diagnosed by physical exam. Your caregiver may also order X-rays or other imaging tests. °TREATMENT °Your caregiver may recommend certain medicines or exercises for your treatment. °HOME CARE INSTRUCTIONS  °· Use a sling or splint for as long as directed by your caregiver until the pain decreases. °· Put ice on the injured area. °· Put ice in a plastic bag. °· Place a towel between your skin and the bag. °· Leave the ice on for 15-20 minutes, 03-04 times a day. °· Avoid using the limb while the tendon is painful. Perform gentle range of motion exercises only as directed by your caregiver. Stop exercises if pain or discomfort increase, unless directed otherwise by your caregiver. °· Only take over-the-counter or prescription medicines for pain, discomfort, or fever as directed by your caregiver. °SEEK MEDICAL CARE IF:  °· Your pain and swelling increase. °· You develop new, unexplained symptoms, especially increased numbness in the hands. °MAKE SURE YOU:  °· Understand these instructions. °· Will watch your condition. °· Will get help right away if you are not doing well or get worse. °Document Released: 06/03/2000 Document Revised: 08/29/2011 Document Reviewed: 08/23/2010 °Exit[employer]® Patient Information ©2014 [employer]. ° °

## 2013-10-26 NOTE — ED Provider Notes (Signed)
Medical screening examination/treatment/procedure(s) were performed by non-physician practitioner and as supervising physician I was immediately available for consultation/collaboration.   EKG Interpretation None        Benny LennertJoseph L Chistopher Mangino, MD 10/26/13 2249

## 2013-11-01 ENCOUNTER — Ambulatory Visit (HOSPITAL_COMMUNITY): Payer: Self-pay

## 2013-11-21 ENCOUNTER — Ambulatory Visit (INDEPENDENT_AMBULATORY_CARE_PROVIDER_SITE_OTHER): Payer: Medicaid Other

## 2013-11-21 ENCOUNTER — Ambulatory Visit (INDEPENDENT_AMBULATORY_CARE_PROVIDER_SITE_OTHER): Payer: Medicaid Other | Admitting: Orthopedic Surgery

## 2013-11-21 ENCOUNTER — Encounter: Payer: Self-pay | Admitting: Orthopedic Surgery

## 2013-11-21 DIAGNOSIS — M7711 Lateral epicondylitis, right elbow: Secondary | ICD-10-CM

## 2013-11-21 DIAGNOSIS — M7710 Lateral epicondylitis, unspecified elbow: Secondary | ICD-10-CM

## 2013-11-21 DIAGNOSIS — M25521 Pain in right elbow: Secondary | ICD-10-CM

## 2013-11-21 DIAGNOSIS — M25529 Pain in unspecified elbow: Secondary | ICD-10-CM

## 2013-11-21 NOTE — Patient Instructions (Signed)
Ice 3 times a day for 20 minutes Do exercises
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
## 2013-11-26 ENCOUNTER — Ambulatory Visit (INDEPENDENT_AMBULATORY_CARE_PROVIDER_SITE_OTHER): Payer: 59 | Admitting: Psychiatry

## 2013-11-26 ENCOUNTER — Encounter (HOSPITAL_COMMUNITY): Payer: Self-pay | Admitting: Psychiatry

## 2013-11-26 VITALS — BP 120/80 | Ht 60.0 in | Wt 156.0 lb

## 2013-11-26 DIAGNOSIS — F411 Generalized anxiety disorder: Secondary | ICD-10-CM

## 2013-11-26 DIAGNOSIS — F17200 Nicotine dependence, unspecified, uncomplicated: Secondary | ICD-10-CM

## 2013-11-26 DIAGNOSIS — F419 Anxiety disorder, unspecified: Secondary | ICD-10-CM

## 2013-11-26 DIAGNOSIS — F319 Bipolar disorder, unspecified: Secondary | ICD-10-CM

## 2013-11-26 MED ORDER — ALPRAZOLAM 1 MG PO TABS: 1.0000 mg | ORAL_TABLET | Freq: Three times a day (TID) | ORAL | Status: DC

## 2013-11-26 MED ORDER — ARIPIPRAZOLE 20 MG PO TABS: 20.0000 mg | ORAL_TABLET | Freq: Every day | ORAL | Status: DC

## 2013-11-26 MED ORDER — MIRTAZAPINE 45 MG PO TABS: 45.0000 mg | ORAL_TABLET | Freq: Every day | ORAL | Status: DC

## 2013-11-26 MED ORDER — BENZTROPINE MESYLATE 1 MG PO TABS: 0.5000 mg | ORAL_TABLET | Freq: Two times a day (BID) | ORAL | Status: DC
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
## 2013-12-19 ENCOUNTER — Telehealth (HOSPITAL_COMMUNITY): Payer: Self-pay | Admitting: *Deleted

## 2013-12-23 ENCOUNTER — Encounter (HOSPITAL_COMMUNITY): Payer: Self-pay | Admitting: Psychiatry

## 2014-01-24 ENCOUNTER — Ambulatory Visit (HOSPITAL_COMMUNITY): Payer: Self-pay | Admitting: Psychiatry

## 2014-02-17 ENCOUNTER — Ambulatory Visit (HOSPITAL_COMMUNITY): Payer: Self-pay | Admitting: Psychiatry

## 2014-02-19 IMAGING — CT CT ABD-PELV W/ CM
2 of 4 series · 16 of 46 positions shown, 18 images · IV contrast (Omnipaque 300)
Comparison: 01/16/2012

CLINICAL DATA: Jaundice.  Vomiting.  Weakness.

CT ABDOMEN AND PELVIS WITH CONTRAST
TECHNIQUE: Multidetector CT imaging of the abdomen and pelvis was
performed following the standard protocol during bolus
administration of intravenous contrast.
Contrast:  100mL OMNIPAQUE IOHEXOL 300 MG/ML  SOLN

[Series 2: abd_pel_with 5.0 b40f · axial · 0.66mm/px · z∈[+280,+690]mm · 13 of 90 slices shown, 15 images]
[im 4/90  soft-tissue]
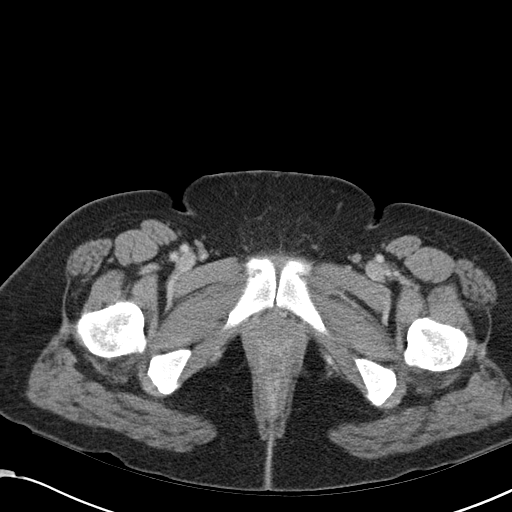
[im 4/90  bone]
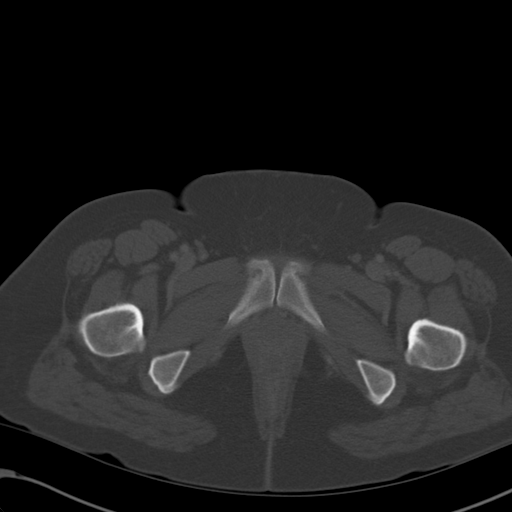
[im 11/90  soft-tissue]
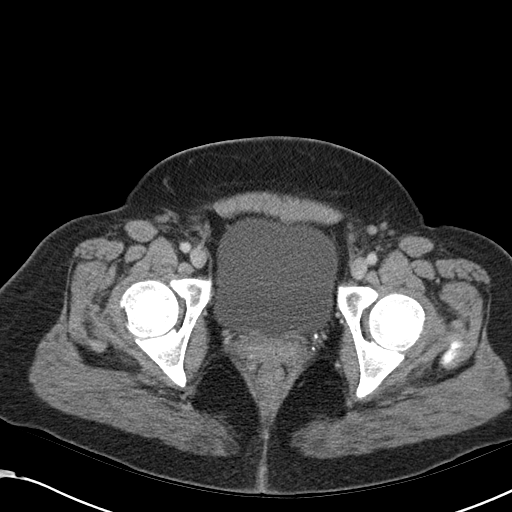
[im 18/90  soft-tissue]
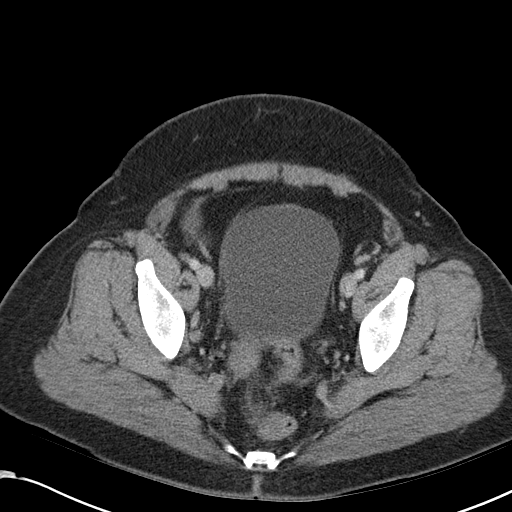
[im 24/90  soft-tissue]
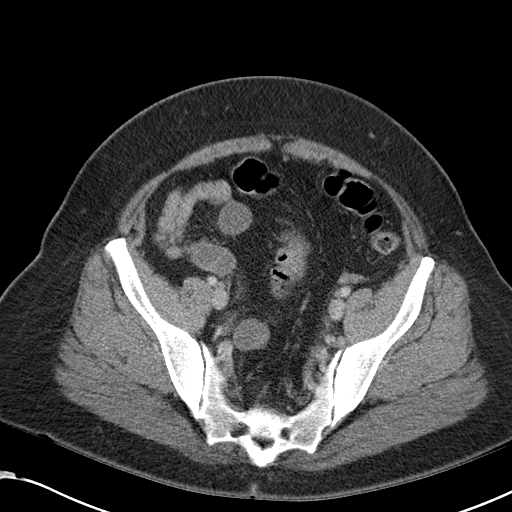
[im 31/90  soft-tissue]
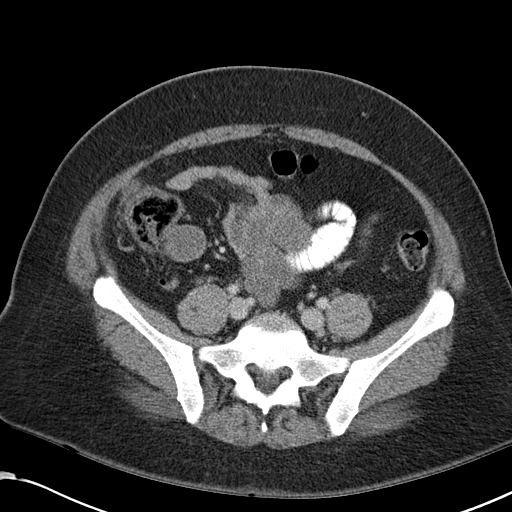
[im 38/90  soft-tissue]
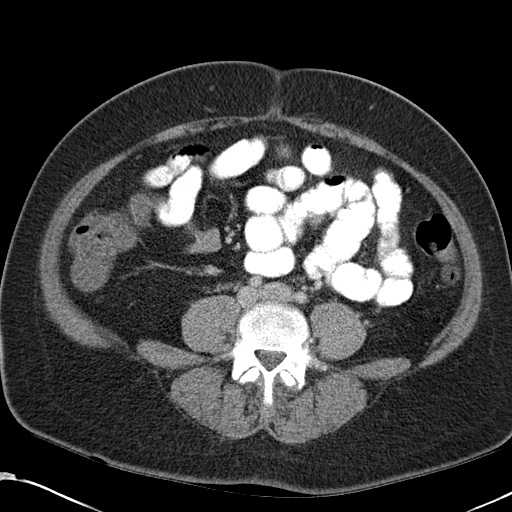
[im 45/90  soft-tissue]
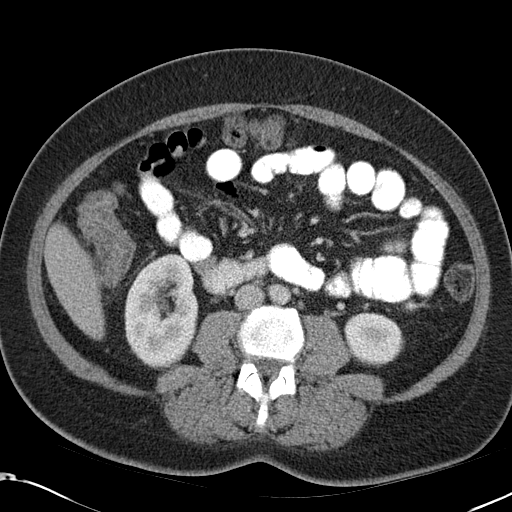
[im 52/90  soft-tissue]
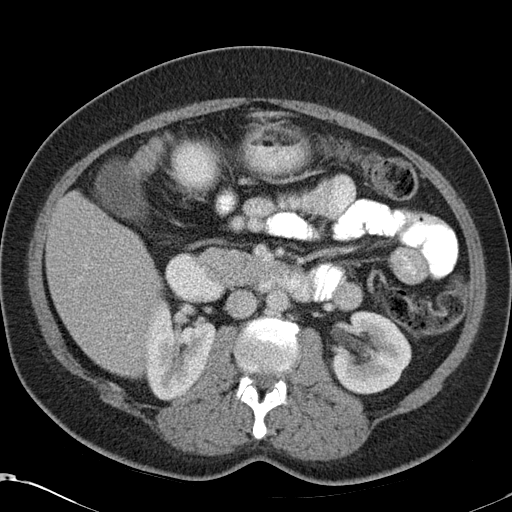
[im 59/90  soft-tissue]
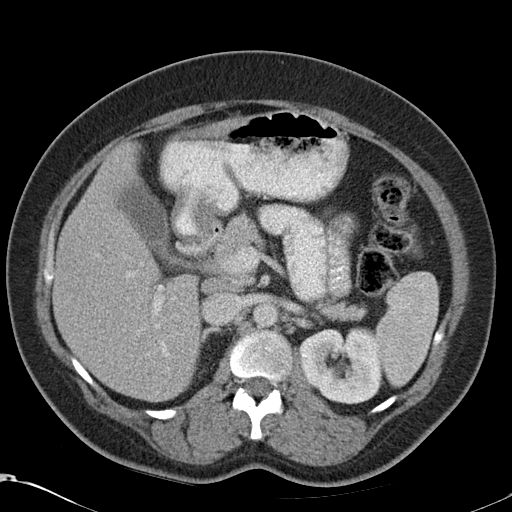
[im 59/90  bone]
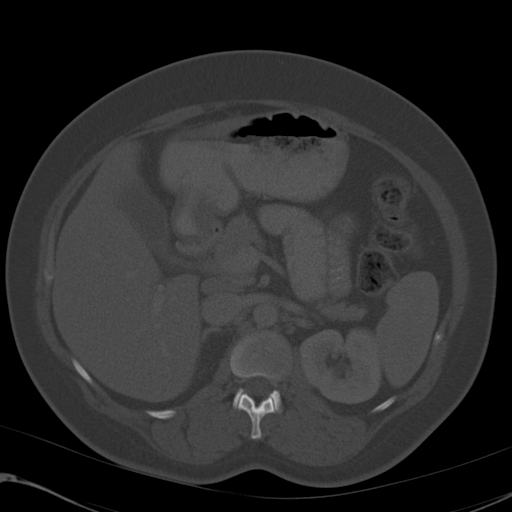
[im 66/90  soft-tissue]
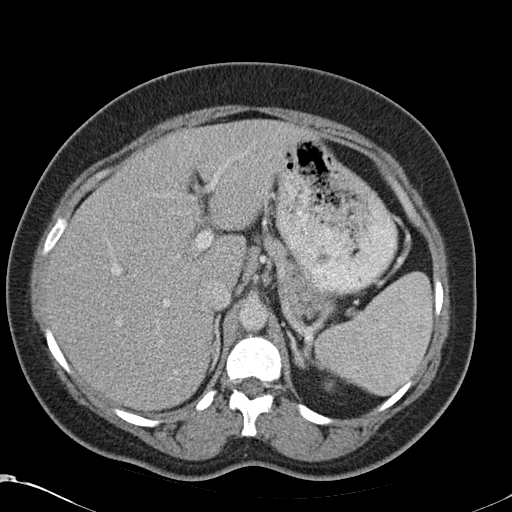
[im 72/90  soft-tissue]
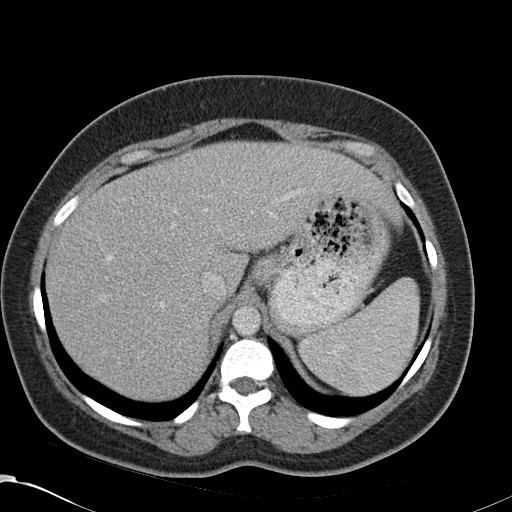
[im 79/90  soft-tissue]
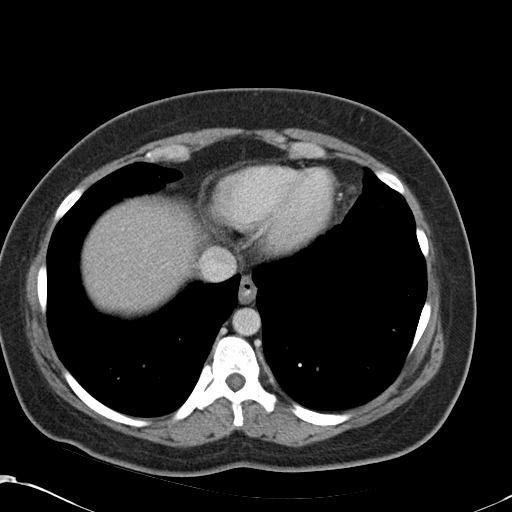
[im 86/90  soft-tissue]
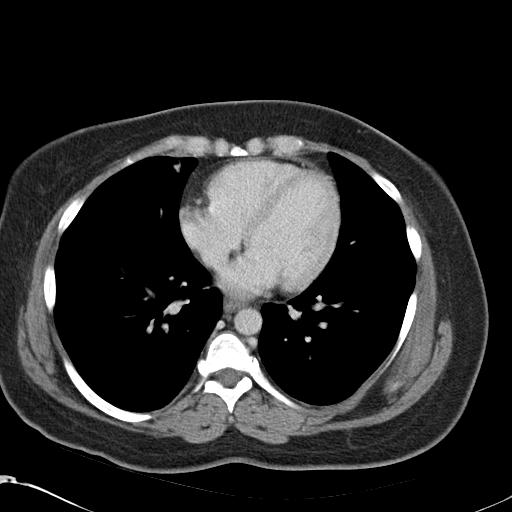

[Series 4: abd_pel_with 3.0 spo cor · coronal · 0.66mm/px · 3 of 95 slices shown]
[im 32/95  soft-tissue]
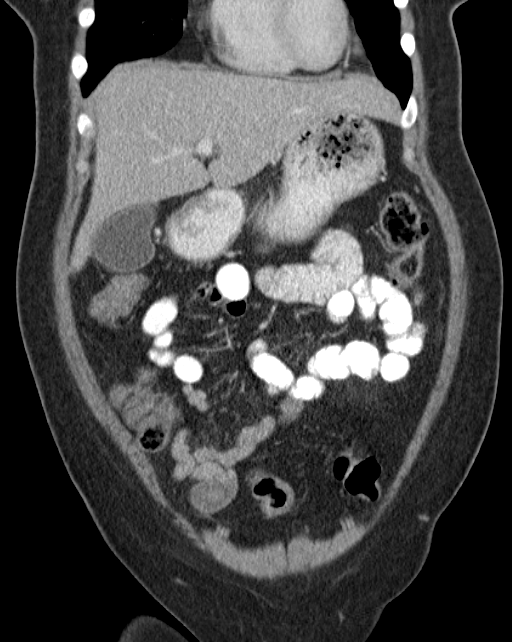
[im 42/95  soft-tissue]
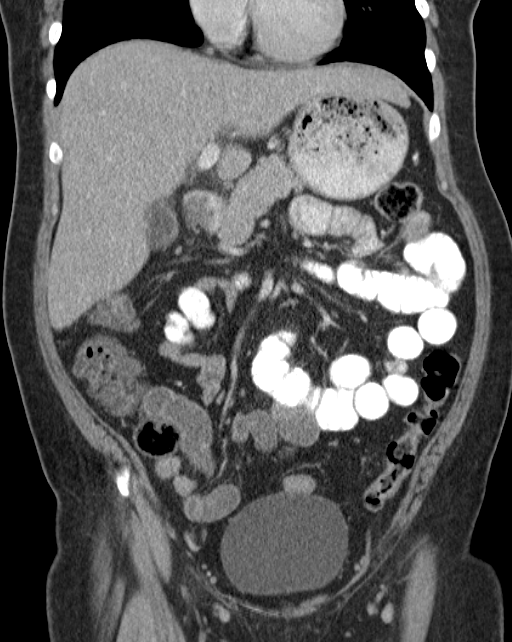
[im 53/95  soft-tissue]
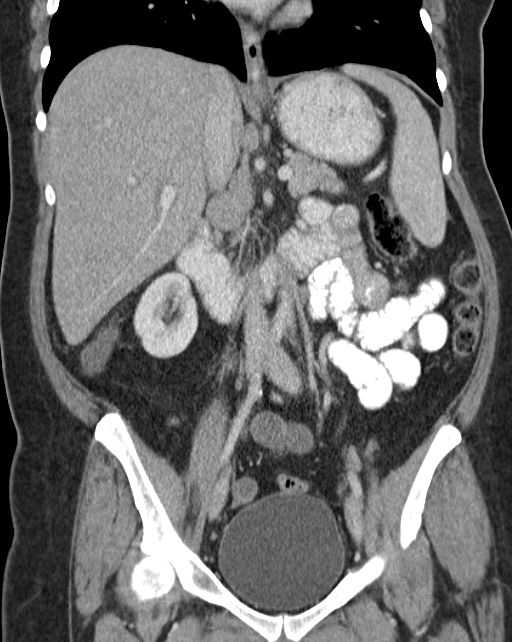

[16 of 46 positions shown; findings below may reference images not displayed]

FINDINGS: The liver is normal appearance.  No evidence of liver
masses or biliary dilatation.  Gallbladder is unremarkable.  The
spleen, pancreas, adrenal glands, and kidneys are normal
appearance.

Mild lymphadenopathy is seen in the region of the celiac axis and
porta hepatis, with largest lymph node in the portacaval space
measuring 1.4 cm. This is slowly more prominent than on previous
study, with the largest portacaval lymph node measuring 9 mm,
however this is nonspecific.  No adenopathy seen elsewhere within
the abdomen or pelvis.

Prior hysterectomy noted.  Adnexal regions are unremarkable.
Normal appendix visualized.  No evidence of inflammatory process or
abnormal fluid collections.  No evidence of dilated bowel loops.
IMPRESSION: 1.  Nonspecific mild porta hepatis and celiac axis lymphadenopathy.
This may be reactive in etiology given the history of jaundice and
presumed elevated liver function tests.  Continued follow-up by CT
is recommended in 3 months.
2.  No radiographic abnormality of the liver.  No evidence of liver
mass or biliary dilatation.

## 2014-02-26 ENCOUNTER — Encounter (HOSPITAL_COMMUNITY): Payer: Self-pay | Admitting: Psychiatry

## 2014-02-26 ENCOUNTER — Ambulatory Visit (INDEPENDENT_AMBULATORY_CARE_PROVIDER_SITE_OTHER): Payer: 59 | Admitting: Psychiatry

## 2014-02-26 VITALS — BP 117/73 | HR 107 | Ht 60.0 in | Wt 152.4 lb

## 2014-02-26 DIAGNOSIS — F17200 Nicotine dependence, unspecified, uncomplicated: Secondary | ICD-10-CM

## 2014-02-26 DIAGNOSIS — F319 Bipolar disorder, unspecified: Secondary | ICD-10-CM

## 2014-02-26 DIAGNOSIS — F419 Anxiety disorder, unspecified: Secondary | ICD-10-CM

## 2014-02-26 DIAGNOSIS — F411 Generalized anxiety disorder: Secondary | ICD-10-CM

## 2014-02-26 MED ORDER — MIRTAZAPINE 45 MG PO TABS: 45.0000 mg | ORAL_TABLET | Freq: Every day | ORAL | Status: DC

## 2014-02-26 MED ORDER — BENZTROPINE MESYLATE 1 MG PO TABS: 0.5000 mg | ORAL_TABLET | Freq: Two times a day (BID) | ORAL | Status: DC

## 2014-02-26 MED ORDER — ALPRAZOLAM 1 MG PO TABS: 1.0000 mg | ORAL_TABLET | Freq: Four times a day (QID) | ORAL | Status: DC

## 2014-02-26 MED ORDER — ARIPIPRAZOLE 20 MG PO TABS: 20.0000 mg | ORAL_TABLET | Freq: Every day | ORAL | Status: DC
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
## 2014-03-26 ENCOUNTER — Ambulatory Visit (HOSPITAL_COMMUNITY): Payer: Self-pay | Admitting: Psychiatry

## 2014-06-03 ENCOUNTER — Ambulatory Visit (INDEPENDENT_AMBULATORY_CARE_PROVIDER_SITE_OTHER): Payer: MEDICAID | Admitting: Psychiatry

## 2014-06-03 ENCOUNTER — Encounter (HOSPITAL_COMMUNITY): Payer: Self-pay | Admitting: Psychiatry

## 2014-06-03 VITALS — BP 113/77 | HR 89 | Ht 60.0 in | Wt 151.6 lb

## 2014-06-03 DIAGNOSIS — F319 Bipolar disorder, unspecified: Secondary | ICD-10-CM

## 2014-06-03 DIAGNOSIS — F419 Anxiety disorder, unspecified: Secondary | ICD-10-CM

## 2014-06-03 DIAGNOSIS — F17200 Nicotine dependence, unspecified, uncomplicated: Secondary | ICD-10-CM

## 2014-06-03 MED ORDER — ARIPIPRAZOLE 20 MG PO TABS: 20.0000 mg | ORAL_TABLET | Freq: Every day | ORAL | Status: DC

## 2014-06-03 MED ORDER — MIRTAZAPINE 45 MG PO TABS: 45.0000 mg | ORAL_TABLET | Freq: Every day | ORAL | Status: DC

## 2014-06-03 MED ORDER — ALPRAZOLAM 1 MG PO TABS: 1.0000 mg | ORAL_TABLET | Freq: Four times a day (QID) | ORAL | Status: DC

## 2014-06-03 MED ORDER — BENZTROPINE MESYLATE 1 MG PO TABS: 0.5000 mg | ORAL_TABLET | Freq: Two times a day (BID) | ORAL | Status: DC

## 2014-06-03 MED ORDER — DULOXETINE HCL 60 MG PO CPEP: 60.0000 mg | ORAL_CAPSULE | Freq: Every day | ORAL | Status: DC
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
## 2014-07-10 ENCOUNTER — Ambulatory Visit (HOSPITAL_COMMUNITY): Payer: Self-pay | Admitting: Psychiatry

## 2014-08-15 ENCOUNTER — Ambulatory Visit (HOSPITAL_COMMUNITY): Payer: Self-pay | Admitting: Psychiatry

## 2014-08-18 ENCOUNTER — Encounter (HOSPITAL_COMMUNITY): Payer: Self-pay | Admitting: Psychiatry

## 2014-08-18 ENCOUNTER — Ambulatory Visit (INDEPENDENT_AMBULATORY_CARE_PROVIDER_SITE_OTHER): Payer: MEDICAID | Admitting: Psychiatry

## 2014-08-18 VITALS — BP 110/74 | Ht 60.0 in | Wt 165.0 lb

## 2014-08-18 DIAGNOSIS — F419 Anxiety disorder, unspecified: Secondary | ICD-10-CM

## 2014-08-18 DIAGNOSIS — F319 Bipolar disorder, unspecified: Secondary | ICD-10-CM

## 2014-08-18 DIAGNOSIS — F17200 Nicotine dependence, unspecified, uncomplicated: Secondary | ICD-10-CM

## 2014-08-18 MED ORDER — BENZTROPINE MESYLATE 1 MG PO TABS: 0.5000 mg | ORAL_TABLET | Freq: Two times a day (BID) | ORAL | Status: DC

## 2014-08-18 MED ORDER — DULOXETINE HCL 60 MG PO CPEP: 60.0000 mg | ORAL_CAPSULE | Freq: Every day | ORAL | Status: DC

## 2014-08-18 MED ORDER — ARIPIPRAZOLE 20 MG PO TABS: 20.0000 mg | ORAL_TABLET | Freq: Every day | ORAL | Status: DC

## 2014-08-18 MED ORDER — ALPRAZOLAM 1 MG PO TABS: 1.0000 mg | ORAL_TABLET | Freq: Four times a day (QID) | ORAL | Status: DC

## 2014-08-18 MED ORDER — MIRTAZAPINE 45 MG PO TABS: 45.0000 mg | ORAL_TABLET | Freq: Every day | ORAL | Status: DC
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
## 2014-09-01 ENCOUNTER — Telehealth (HOSPITAL_COMMUNITY): Payer: Self-pay | Admitting: *Deleted
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
## 2014-09-02 NOTE — Telephone Encounter (Signed)
We cannot refill controlled meds early

## 2014-09-02 NOTE — Telephone Encounter (Signed)
lmtcb

## 2014-09-02 NOTE — Telephone Encounter (Signed)
Informed pt what Dr. Tenny Crawoss stated and she showed understanding

## 2014-11-14 ENCOUNTER — Ambulatory Visit (INDEPENDENT_AMBULATORY_CARE_PROVIDER_SITE_OTHER): Payer: MEDICAID | Admitting: Psychiatry

## 2014-11-14 ENCOUNTER — Encounter (HOSPITAL_COMMUNITY): Payer: Self-pay | Admitting: Psychiatry

## 2014-11-14 VITALS — BP 105/64 | HR 98 | Ht 60.0 in | Wt 157.8 lb

## 2014-11-14 DIAGNOSIS — F319 Bipolar disorder, unspecified: Secondary | ICD-10-CM

## 2014-11-14 DIAGNOSIS — F419 Anxiety disorder, unspecified: Secondary | ICD-10-CM

## 2014-11-14 MED ORDER — MIRTAZAPINE 45 MG PO TABS: 45.0000 mg | ORAL_TABLET | Freq: Every day | ORAL | Status: DC

## 2014-11-14 MED ORDER — ARIPIPRAZOLE 20 MG PO TABS: 20.0000 mg | ORAL_TABLET | Freq: Every day | ORAL | Status: DC

## 2014-11-14 MED ORDER — BENZTROPINE MESYLATE 1 MG PO TABS: 0.5000 mg | ORAL_TABLET | Freq: Two times a day (BID) | ORAL | Status: DC

## 2014-11-14 MED ORDER — DULOXETINE HCL 60 MG PO CPEP: 60.0000 mg | ORAL_CAPSULE | Freq: Every day | ORAL | Status: DC

## 2014-11-14 MED ORDER — ALPRAZOLAM 1 MG PO TABS: 1.0000 mg | ORAL_TABLET | Freq: Four times a day (QID) | ORAL | Status: DC
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
## 2015-02-19 ENCOUNTER — Ambulatory Visit (INDEPENDENT_AMBULATORY_CARE_PROVIDER_SITE_OTHER): Payer: Medicaid Other | Admitting: Psychiatry

## 2015-02-19 ENCOUNTER — Encounter (HOSPITAL_COMMUNITY): Payer: Self-pay | Admitting: Psychiatry

## 2015-02-19 VITALS — BP 117/86 | HR 78 | Ht 60.0 in | Wt 160.0 lb

## 2015-02-19 DIAGNOSIS — F319 Bipolar disorder, unspecified: Secondary | ICD-10-CM

## 2015-02-19 DIAGNOSIS — F419 Anxiety disorder, unspecified: Secondary | ICD-10-CM

## 2015-02-19 MED ORDER — DULOXETINE HCL 60 MG PO CPEP: 60.0000 mg | ORAL_CAPSULE | Freq: Every day | ORAL | Status: DC

## 2015-02-19 MED ORDER — ALPRAZOLAM 1 MG PO TABS: 1.0000 mg | ORAL_TABLET | Freq: Four times a day (QID) | ORAL | Status: DC

## 2015-02-19 MED ORDER — ARIPIPRAZOLE 20 MG PO TABS: 20.0000 mg | ORAL_TABLET | Freq: Every day | ORAL | Status: DC

## 2015-02-19 MED ORDER — MIRTAZAPINE 45 MG PO TABS: 45.0000 mg | ORAL_TABLET | Freq: Every day | ORAL | Status: DC

## 2015-02-19 MED ORDER — BENZTROPINE MESYLATE 1 MG PO TABS: 0.5000 mg | ORAL_TABLET | Freq: Two times a day (BID) | ORAL | Status: DC
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
## 2015-05-25 ENCOUNTER — Encounter (HOSPITAL_COMMUNITY): Payer: Self-pay | Admitting: Psychiatry

## 2015-05-25 ENCOUNTER — Ambulatory Visit (INDEPENDENT_AMBULATORY_CARE_PROVIDER_SITE_OTHER): Payer: Medicaid Other | Admitting: Psychiatry

## 2015-05-25 VITALS — BP 124/96 | HR 105 | Ht 60.0 in | Wt 156.4 lb

## 2015-05-25 DIAGNOSIS — F319 Bipolar disorder, unspecified: Secondary | ICD-10-CM

## 2015-05-25 DIAGNOSIS — F419 Anxiety disorder, unspecified: Secondary | ICD-10-CM | POA: Diagnosis not present

## 2015-05-25 MED ORDER — ARIPIPRAZOLE 20 MG PO TABS: 20.0000 mg | ORAL_TABLET | Freq: Every day | ORAL | Status: DC

## 2015-05-25 MED ORDER — BENZTROPINE MESYLATE 1 MG PO TABS: 0.5000 mg | ORAL_TABLET | Freq: Two times a day (BID) | ORAL | Status: DC

## 2015-05-25 MED ORDER — MIRTAZAPINE 45 MG PO TABS: 45.0000 mg | ORAL_TABLET | Freq: Every day | ORAL | Status: DC

## 2015-05-25 MED ORDER — DULOXETINE HCL 60 MG PO CPEP: 60.0000 mg | ORAL_CAPSULE | Freq: Every day | ORAL | Status: DC

## 2015-05-25 MED ORDER — ALPRAZOLAM 1 MG PO TABS: 1.0000 mg | ORAL_TABLET | Freq: Four times a day (QID) | ORAL | Status: DC
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
## 2015-07-22 ENCOUNTER — Telehealth (HOSPITAL_COMMUNITY): Payer: Self-pay | Admitting: *Deleted

## 2015-08-19 ENCOUNTER — Ambulatory Visit (INDEPENDENT_AMBULATORY_CARE_PROVIDER_SITE_OTHER): Payer: Medicaid Other | Admitting: Psychiatry

## 2015-08-19 ENCOUNTER — Encounter (HOSPITAL_COMMUNITY): Payer: Self-pay | Admitting: Psychiatry

## 2015-08-19 VITALS — BP 133/67 | HR 111 | Ht 60.0 in | Wt 160.4 lb

## 2015-08-19 DIAGNOSIS — F319 Bipolar disorder, unspecified: Secondary | ICD-10-CM | POA: Diagnosis not present

## 2015-08-19 DIAGNOSIS — F419 Anxiety disorder, unspecified: Secondary | ICD-10-CM

## 2015-08-19 MED ORDER — ARIPIPRAZOLE 20 MG PO TABS: 20.0000 mg | ORAL_TABLET | Freq: Every day | ORAL | Status: DC

## 2015-08-19 MED ORDER — MIRTAZAPINE 45 MG PO TABS: 45.0000 mg | ORAL_TABLET | Freq: Every day | ORAL | Status: DC

## 2015-08-19 MED ORDER — ALPRAZOLAM 1 MG PO TABS: 1.0000 mg | ORAL_TABLET | Freq: Four times a day (QID) | ORAL | Status: DC

## 2015-08-19 MED ORDER — DULOXETINE HCL 60 MG PO CPEP: 60.0000 mg | ORAL_CAPSULE | Freq: Every day | ORAL | Status: DC

## 2015-08-19 MED ORDER — BENZTROPINE MESYLATE 1 MG PO TABS
0.5000 mg | ORAL_TABLET | Freq: Two times a day (BID) | ORAL | Status: DC
Start: 2015-08-19 — End: 2015-11-19
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
## 2015-08-21 ENCOUNTER — Ambulatory Visit (HOSPITAL_COMMUNITY): Payer: Self-pay | Admitting: Psychiatry

## 2015-11-19 ENCOUNTER — Ambulatory Visit (INDEPENDENT_AMBULATORY_CARE_PROVIDER_SITE_OTHER): Payer: Medicaid Other | Admitting: Psychiatry

## 2015-11-19 ENCOUNTER — Encounter (HOSPITAL_COMMUNITY): Payer: Self-pay | Admitting: Psychiatry

## 2015-11-19 VITALS — BP 133/91 | HR 82 | Ht 60.0 in | Wt 152.0 lb

## 2015-11-19 DIAGNOSIS — F419 Anxiety disorder, unspecified: Secondary | ICD-10-CM | POA: Diagnosis not present

## 2015-11-19 DIAGNOSIS — F319 Bipolar disorder, unspecified: Secondary | ICD-10-CM

## 2015-11-19 MED ORDER — ARIPIPRAZOLE 20 MG PO TABS: 20.0000 mg | ORAL_TABLET | Freq: Every day | ORAL | Status: DC

## 2015-11-19 MED ORDER — ALPRAZOLAM 1 MG PO TABS: 1.0000 mg | ORAL_TABLET | Freq: Four times a day (QID) | ORAL | Status: DC

## 2015-11-19 MED ORDER — DULOXETINE HCL 60 MG PO CPEP: 60.0000 mg | ORAL_CAPSULE | Freq: Every day | ORAL | Status: DC

## 2015-11-19 MED ORDER — BENZTROPINE MESYLATE 1 MG PO TABS: 0.5000 mg | ORAL_TABLET | Freq: Two times a day (BID) | ORAL | Status: DC

## 2015-11-19 MED ORDER — MIRTAZAPINE 45 MG PO TABS: 45.0000 mg | ORAL_TABLET | Freq: Every day | ORAL | Status: DC
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
## 2016-02-17 ENCOUNTER — Encounter (HOSPITAL_COMMUNITY): Payer: Self-pay | Admitting: Psychiatry

## 2016-02-17 ENCOUNTER — Ambulatory Visit (INDEPENDENT_AMBULATORY_CARE_PROVIDER_SITE_OTHER): Payer: Medicaid Other | Admitting: Psychiatry

## 2016-02-17 ENCOUNTER — Encounter: Payer: Self-pay | Admitting: Psychiatry

## 2016-02-17 VITALS — BP 107/79 | HR 90 | Ht 60.0 in | Wt 156.4 lb

## 2016-02-17 DIAGNOSIS — F319 Bipolar disorder, unspecified: Secondary | ICD-10-CM | POA: Diagnosis not present

## 2016-02-17 DIAGNOSIS — F419 Anxiety disorder, unspecified: Secondary | ICD-10-CM

## 2016-02-17 MED ORDER — BENZTROPINE MESYLATE 1 MG PO TABS: 0.5000 mg | ORAL_TABLET | Freq: Two times a day (BID) | ORAL | 2 refills | Status: DC

## 2016-02-17 MED ORDER — ARIPIPRAZOLE 20 MG PO TABS: 20.0000 mg | ORAL_TABLET | Freq: Every day | ORAL | 2 refills | Status: DC

## 2016-02-17 MED ORDER — MIRTAZAPINE 45 MG PO TABS: 45.0000 mg | ORAL_TABLET | Freq: Every day | ORAL | 2 refills | Status: DC

## 2016-02-17 MED ORDER — DULOXETINE HCL 60 MG PO CPEP: 60.0000 mg | ORAL_CAPSULE | Freq: Every day | ORAL | 2 refills | Status: DC

## 2016-02-17 MED ORDER — ALPRAZOLAM 1 MG PO TABS: 1.0000 mg | ORAL_TABLET | Freq: Four times a day (QID) | ORAL | 2 refills | Status: DC
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
## 2016-03-01 ENCOUNTER — Encounter (HOSPITAL_COMMUNITY): Payer: Self-pay

## 2016-03-01 ENCOUNTER — Ambulatory Visit (HOSPITAL_COMMUNITY): Payer: Medicaid Other | Admitting: Psychiatry

## 2016-03-01 ENCOUNTER — Telehealth (HOSPITAL_COMMUNITY): Payer: Self-pay | Admitting: *Deleted

## 2016-03-01 NOTE — Telephone Encounter (Signed)
Per verbal from Dr. Tenny Crawoss to call pt pharmacy and inform them to stop refilling pt Xanax. Called pt pharmacy [facility]) that's on file and informed them with what provider stated. Spoke with Myra from pharmacy and informed her with what provider stated. Per Myra, pt have not had her Xanax filled with them per their records for 2 years now but they will put note on file. Myra verbalized understanding. Called pt mobile number on file and pt mother picked up stated pt was not at home today. Informed mother staff will call back. Mother verbalized understanding. Called pt home number and phone kept ringing with no answer

## 2016-04-12 ENCOUNTER — Telehealth (HOSPITAL_COMMUNITY): Payer: Self-pay | Admitting: *Deleted

## 2016-04-12 NOTE — Telephone Encounter (Signed)
Called pt mobile and lmtcb to resch appt for 05-17-2016 and office number was provided. Called pt home number and phone only rang with no voicemail.

## 2016-04-14 ENCOUNTER — Telehealth (HOSPITAL_COMMUNITY): Payer: Self-pay | Admitting: *Deleted

## 2016-04-14 NOTE — Telephone Encounter (Signed)
Called pt to cancel appt for Nov 28 due to provider being out of office. Pt phone just rang on her mobile and no voicemail and home number just rang with no response.

## 2016-05-04 ENCOUNTER — Telehealth (HOSPITAL_COMMUNITY): Payer: Self-pay | Admitting: *Deleted

## 2016-05-04 NOTE — Telephone Encounter (Signed)
left message with Whitney PostLogan to ask patient to call regarding an appointment.  Provider out of office 05/17/16.

## 2016-05-09 ENCOUNTER — Telehealth (HOSPITAL_COMMUNITY): Payer: Self-pay | Admitting: *Deleted

## 2016-05-09 ENCOUNTER — Other Ambulatory Visit (HOSPITAL_COMMUNITY): Payer: Self-pay | Admitting: *Deleted

## 2016-05-09 DIAGNOSIS — F419 Anxiety disorder, unspecified: Secondary | ICD-10-CM

## 2016-05-09 MED ORDER — ALPRAZOLAM 1 MG PO TABS
1.0000 mg | ORAL_TABLET | Freq: Four times a day (QID) | ORAL | 0 refills | Status: DC
Start: 2016-05-09 — End: 2019-10-26

## 2016-05-09 NOTE — Telephone Encounter (Signed)
Refill request received for Alprazolam 1mg .

## 2016-05-09 NOTE — Telephone Encounter (Signed)
Yes, one month supply.

## 2016-05-09 NOTE — Telephone Encounter (Signed)
Refill request for Alprazolam, chart reviewed, Dr. Tenny Crawoss gave approval for 30 day supply. Call [facility] to [facility].

## 2016-05-10 ENCOUNTER — Other Ambulatory Visit (HOSPITAL_COMMUNITY): Payer: Self-pay | Admitting: *Deleted

## 2016-05-10 NOTE — Telephone Encounter (Signed)
Refill for alprazolam. Refill completed 05/09/16, no longer needed.

## 2016-05-17 ENCOUNTER — Ambulatory Visit (HOSPITAL_COMMUNITY): Payer: Self-pay | Admitting: Psychiatry

## 2016-06-30 ENCOUNTER — Other Ambulatory Visit (HOSPITAL_COMMUNITY): Payer: Self-pay | Admitting: Psychiatry

## 2019-03-11 ENCOUNTER — Ambulatory Visit: Payer: Medicaid Other | Admitting: Family Medicine

## 2019-03-13 ENCOUNTER — Encounter: Payer: Self-pay | Admitting: General Practice

## 2019-10-26 ENCOUNTER — Other Ambulatory Visit: Payer: Self-pay

## 2019-10-26 ENCOUNTER — Emergency Department (HOSPITAL_COMMUNITY): Payer: Medicaid Other

## 2019-10-26 ENCOUNTER — Emergency Department (HOSPITAL_COMMUNITY)
Admission: EM | Admit: 2019-10-26 | Discharge: 2019-10-27 | Disposition: A | Discharge status: Home / Self Care | Payer: Medicaid Other | Attending: Emergency Medicine | Admitting: Emergency Medicine

## 2019-10-26 ENCOUNTER — Encounter (HOSPITAL_COMMUNITY): Payer: Self-pay | Admitting: Emergency Medicine

## 2019-10-26 DIAGNOSIS — Z20822 Contact with and (suspected) exposure to covid-19: Secondary | ICD-10-CM | POA: Diagnosis not present

## 2019-10-26 DIAGNOSIS — F1520 Other stimulant dependence, uncomplicated: Secondary | ICD-10-CM | POA: Insufficient documentation

## 2019-10-26 DIAGNOSIS — F314 Bipolar disorder, current episode depressed, severe, without psychotic features: Secondary | ICD-10-CM | POA: Diagnosis not present

## 2019-10-26 DIAGNOSIS — F1420 Cocaine dependence, uncomplicated: Secondary | ICD-10-CM | POA: Insufficient documentation

## 2019-10-26 DIAGNOSIS — F17210 Nicotine dependence, cigarettes, uncomplicated: Secondary | ICD-10-CM | POA: Diagnosis not present

## 2019-10-26 DIAGNOSIS — F1120 Opioid dependence, uncomplicated: Secondary | ICD-10-CM | POA: Insufficient documentation

## 2019-10-26 DIAGNOSIS — Z046 Encounter for general psychiatric examination, requested by authority: Secondary | ICD-10-CM | POA: Diagnosis present

## 2019-10-26 DIAGNOSIS — R45851 Suicidal ideations: Secondary | ICD-10-CM

## 2019-10-26 DIAGNOSIS — F1910 Other psychoactive substance abuse, uncomplicated: Secondary | ICD-10-CM

## 2019-10-26 LAB — COMPREHENSIVE METABOLIC PANEL
ALT: 24 U/L (ref 0–44)
AST: 39 U/L (ref 15–41)
Albumin: 5.3 g/dL — ABNORMAL HIGH (ref 3.5–5.0)
Alkaline Phosphatase: 65 U/L (ref 38–126)
Anion gap: 13 (ref 5–15)
BUN: 23 mg/dL — ABNORMAL HIGH (ref 6–20)
CO2: 24 mmol/L (ref 22–32)
Calcium: 10.2 mg/dL (ref 8.9–10.3)
Chloride: 100 mmol/L (ref 98–111)
Creatinine, Ser: 0.94 mg/dL (ref 0.44–1.00)
GFR calc Af Amer: >60 mL/min (ref >60)
GFR calc non Af Amer: >60 mL/min (ref >60)
Glucose, Bld: 115 mg/dL — ABNORMAL HIGH (ref 70–99)
Potassium: 4.1 mmol/L (ref 3.5–5.1)
Sodium: 137 mmol/L (ref 135–145)
Total Bilirubin: 0.9 mg/dL (ref 0.3–1.2)
Total Protein: 9.4 g/dL — ABNORMAL HIGH (ref 6.5–8.1)

## 2019-10-26 LAB — CBC WITH DIFFERENTIAL/PLATELET
Abs Immature Granulocytes: 0.03 10*3/uL (ref 0.00–0.07)
Basophils Absolute: 0.1 10*3/uL (ref 0.0–0.1)
Basophils Relative: 1 %
Eosinophils Absolute: 0.1 10*3/uL (ref 0.0–0.5)
Eosinophils Relative: 1 %
HCT: 43.9 % (ref 36.0–46.0)
Hemoglobin: 14.2 g/dL (ref 12.0–15.0)
Immature Granulocytes: 0 %
Lymphocytes Relative: 19 %
Lymphs Abs: 1.9 10*3/uL (ref 0.7–4.0)
MCH: 26.7 pg (ref 26.0–34.0)
MCHC: 32.3 g/dL (ref 30.0–36.0)
MCV: 82.5 fL (ref 80.0–100.0)
Monocytes Absolute: 1.1 10*3/uL — ABNORMAL HIGH (ref 0.1–1.0)
Monocytes Relative: 11 %
Neutro Abs: 6.9 10*3/uL (ref 1.7–7.7)
Neutrophils Relative %: 68 %
Platelets: 463 10*3/uL — ABNORMAL HIGH (ref 150–400)
RBC: 5.32 MIL/uL — ABNORMAL HIGH (ref 3.87–5.11)
RDW: 14.1 % (ref 11.5–15.5)
WBC: 10 10*3/uL (ref 4.0–10.5)
nRBC: 0 % (ref 0.0–0.2)

## 2019-10-26 LAB — RAPID URINE DRUG SCREEN, HOSP PERFORMED
Amphetamines: POSITIVE — AB
Barbiturates: NOT DETECTED
Benzodiazepines: POSITIVE — AB
Cocaine: POSITIVE — AB
Opiates: POSITIVE — AB
Tetrahydrocannabinol: NOT DETECTED

## 2019-10-26 LAB — SALICYLATE LEVEL: Salicylate Lvl: <7 mg/dL — ABNORMAL LOW (ref 7.0–30.0)

## 2019-10-26 LAB — RESPIRATORY PANEL BY RT PCR (FLU A&B, COVID)
Influenza A by PCR: NEGATIVE
Influenza B by PCR: NEGATIVE
SARS Coronavirus 2 by RT PCR: NEGATIVE

## 2019-10-26 LAB — POC URINE PREG, ED: Preg Test, Ur: NEGATIVE

## 2019-10-26 LAB — TROPONIN I (HIGH SENSITIVITY)
Troponin I (High Sensitivity): 3 ng/L (ref <18)
Troponin I (High Sensitivity): 3 ng/L (ref <18)

## 2019-10-26 LAB — ACETAMINOPHEN LEVEL: Acetaminophen (Tylenol), Serum: <10 ug/mL — ABNORMAL LOW (ref 10–30)

## 2019-10-26 LAB — ETHANOL: Alcohol, Ethyl (B): <10 mg/dL (ref <10)

## 2019-10-26 MED ORDER — NICOTINE 21 MG/24HR TD PT24: 21.0000 mg | MEDICATED_PATCH | Freq: Every day | TRANSDERMAL | Status: DC

## 2019-10-26 MED ORDER — LORAZEPAM 1 MG PO TABS
1.0000 mg | ORAL_TABLET | Freq: Once | ORAL | Status: AC
  Administered 2019-10-26: 1 mg via ORAL
  Filled 2019-10-26: qty 1

## 2019-10-26 MED ORDER — ONDANSETRON HCL 4 MG PO TABS: 4.0000 mg | ORAL_TABLET | Freq: Three times a day (TID) | ORAL | Status: DC | PRN

## 2019-10-26 MED ORDER — LORAZEPAM 2 MG/ML IJ SOLN: 1.0000 mg | Freq: Once | INTRAMUSCULAR | Status: DC
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
## 2019-10-26 NOTE — ED Notes (Signed)
Pt eating dinner

## 2019-10-26 NOTE — ED Notes (Signed)
Pt remains suspicious but cooperative with a lot of reassurance and explanation. Pt's aunt remains at bedside and is supportive.

## 2019-10-26 NOTE — ED Notes (Signed)
TTS to bedside. 
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
## 2019-10-26 NOTE — ED Notes (Signed)
BH recommends inpatient treatment. No beds available at [facility] so they will begin looking for placement

## 2019-10-26 NOTE — ED Notes (Signed)
IVC papers faxed to [facility] and they were called. 

## 2019-10-26 NOTE — ED Notes (Signed)
Pt's aunt, Billey Co phone number [phone]

## 2019-10-27 ENCOUNTER — Inpatient Hospital Stay (HOSPITAL_COMMUNITY)
Admission: AD | Admit: 2019-10-27 | Discharge: 2019-10-31 | DRG: 897 | Disposition: A | Discharge status: Home / Self Care | Payer: Medicaid Other | Source: Intra-hospital | Attending: Psychiatry | Admitting: Psychiatry

## 2019-10-27 ENCOUNTER — Other Ambulatory Visit: Payer: Self-pay

## 2019-10-27 ENCOUNTER — Encounter (HOSPITAL_COMMUNITY): Payer: Self-pay | Admitting: Psychiatry

## 2019-10-27 DIAGNOSIS — R45851 Suicidal ideations: Secondary | ICD-10-CM | POA: Diagnosis present

## 2019-10-27 DIAGNOSIS — F319 Bipolar disorder, unspecified: Secondary | ICD-10-CM | POA: Diagnosis present

## 2019-10-27 DIAGNOSIS — F1514 Other stimulant abuse with stimulant-induced mood disorder: Secondary | ICD-10-CM | POA: Diagnosis present

## 2019-10-27 DIAGNOSIS — Z885 Allergy status to narcotic agent status: Secondary | ICD-10-CM | POA: Diagnosis not present

## 2019-10-27 DIAGNOSIS — F17210 Nicotine dependence, cigarettes, uncomplicated: Secondary | ICD-10-CM | POA: Diagnosis present

## 2019-10-27 DIAGNOSIS — F1594 Other stimulant use, unspecified with stimulant-induced mood disorder: Secondary | ICD-10-CM | POA: Diagnosis not present

## 2019-10-27 DIAGNOSIS — Z888 Allergy status to other drugs, medicaments and biological substances status: Secondary | ICD-10-CM

## 2019-10-27 DIAGNOSIS — F1994 Other psychoactive substance use, unspecified with psychoactive substance-induced mood disorder: Secondary | ICD-10-CM | POA: Diagnosis present

## 2019-10-27 DIAGNOSIS — Z818 Family history of other mental and behavioral disorders: Secondary | ICD-10-CM | POA: Diagnosis not present

## 2019-10-27 DIAGNOSIS — F1914 Other psychoactive substance abuse with psychoactive substance-induced mood disorder: Secondary | ICD-10-CM | POA: Diagnosis present

## 2019-10-27 DIAGNOSIS — F310 Bipolar disorder, current episode hypomanic: Secondary | ICD-10-CM | POA: Diagnosis not present

## 2019-10-27 MED ORDER — CLONIDINE HCL 0.1 MG PO TABS: 0.1000 mg | ORAL_TABLET | Freq: Every day | ORAL | Status: DC

## 2019-10-27 MED ORDER — CLONIDINE HCL 0.1 MG PO TABS
0.1000 mg | ORAL_TABLET | Freq: Four times a day (QID) | ORAL | Status: DC
  Administered 2019-10-27 – 2019-10-28 (×2): 0.1 mg via ORAL
  Filled 2019-10-27 (×11): qty 1

## 2019-10-27 MED ORDER — IBUPROFEN 600 MG PO TABS
600.0000 mg | ORAL_TABLET | Freq: Four times a day (QID) | ORAL | Status: DC | PRN
  Administered 2019-10-27 – 2019-10-30 (×2): 600 mg via ORAL
  Filled 2019-10-27 (×2): qty 1

## 2019-10-27 MED ORDER — LOPERAMIDE HCL 2 MG PO CAPS: 2.0000 mg | ORAL_CAPSULE | ORAL | Status: DC | PRN

## 2019-10-27 MED ORDER — METHOCARBAMOL 500 MG PO TABS
500.0000 mg | ORAL_TABLET | Freq: Three times a day (TID) | ORAL | Status: DC | PRN
  Administered 2019-10-27: 500 mg via ORAL
  Filled 2019-10-27: qty 1

## 2019-10-27 MED ORDER — DICYCLOMINE HCL 20 MG PO TABS: 20.0000 mg | ORAL_TABLET | Freq: Four times a day (QID) | ORAL | Status: DC | PRN

## 2019-10-27 MED ORDER — ALUM & MAG HYDROXIDE-SIMETH 200-200-20 MG/5ML PO SUSP: 30.0000 mL | ORAL | Status: DC | PRN

## 2019-10-27 MED ORDER — MAGNESIUM HYDROXIDE 400 MG/5ML PO SUSP: 30.0000 mL | Freq: Every day | ORAL | Status: DC | PRN

## 2019-10-27 MED ORDER — ONDANSETRON 4 MG PO TBDP: 4.0000 mg | ORAL_TABLET | Freq: Four times a day (QID) | ORAL | Status: DC | PRN

## 2019-10-27 MED ORDER — HYDROXYZINE HCL 25 MG PO TABS
25.0000 mg | ORAL_TABLET | Freq: Four times a day (QID) | ORAL | Status: DC | PRN
  Administered 2019-10-27: 25 mg via ORAL
  Filled 2019-10-27: qty 1

## 2019-10-27 MED ORDER — TRAZODONE HCL 50 MG PO TABS
50.0000 mg | ORAL_TABLET | Freq: Every evening | ORAL | Status: DC | PRN
  Filled 2019-10-27 (×3): qty 1

## 2019-10-27 MED ORDER — CHLORDIAZEPOXIDE HCL 25 MG PO CAPS: 25.0000 mg | ORAL_CAPSULE | Freq: Three times a day (TID) | ORAL | Status: DC | PRN

## 2019-10-27 MED ORDER — CLONIDINE HCL 0.1 MG PO TABS: 0.1000 mg | ORAL_TABLET | ORAL | Status: DC

## 2019-10-27 NOTE — Progress Notes (Signed)
Pt accepted to service of MD [facility] at [facility].  She will be assigned to room 502-02.  Report may be called to [employer] or Kennis Carina at [phone]

## 2019-10-27 NOTE — ED Notes (Signed)
Pt walked to the bathroom and back to her room, cooperative during the entire time. Will continue to monitor pt.

## 2019-10-27 NOTE — ED Notes (Signed)
[employer] called to send transport to [facility]

## 2019-10-27 NOTE — ED Notes (Signed)
[facility] transport patient from facility

## 2019-10-27 NOTE — ED Notes (Signed)
Pt.s breakfast arrived, pt was not responding when attempting to awaken. Was told to pt by another tech that their breakfast is in their room. Pt now awoken and eating breakfast on the side of the bed. Will continue to monitor pt.
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
## 2019-10-27 NOTE — ED Provider Notes (Signed)
Emergency Medicine Observation Re-evaluation Note  Kari Le is a 49 y.o. female, seen on rounds today.  Pt initially presented to the ED for complaints of No chief complaint on file. Currently, the patient is awaiting psyc placement  Physical Exam  BP 95/62 (BP Location: Left Arm)   Pulse 96   Temp 98.6 F (37 C) (Oral)   Resp 16   Wt 56.7 kg   SpO2 95%   BMI 24.41 kg/m  Physical Exam alert in nad  ED Course / MDM  EKG:EKG Interpretation  Date/Time:  Saturday Oct 26 2019 17:21:42 EDT Ventricular Rate:  111 PR Interval:  130 QRS Duration: 76 QT Interval:  328 QTC Calculation: 446 R Axis:   76 Text Interpretation: Sinus tachycardia Otherwise normal ECG Since last tracing rate faster 12/13 Confirmed by Kari Le [phone]) on 10/26/2019 5:33:27 PM    I have reviewed the labs performed to date as well as medications administered while in observation.  Recent changes in the last 24 hours include none Plan  Current plan is for placement    Kari Berkshire, MD 10/27/19 1353

## 2019-10-27 NOTE — Progress Notes (Signed)
Patient meets criteria for inpatient treatment per Nira Conn, NP. No appropriate beds at [facility] currently. CSW faxed referrals to the following facilities for review:  [facility]   [facility]   [facility]   [facility]   [facility]   [facility]   [facility]   [facility]   [facility]   [facility]     TTS will continue to seek bed placement.     Ruthann Cancer MSW, [facility] Social Worker Disposition  [facility] Ph: [phone] Fax: [phone] 10/27/2019 8:44 AM

## 2019-10-27 NOTE — Progress Notes (Signed)
Adult Psychoeducational Group Note  Date:  10/27/2019 Time:  10:24 PM  Group Topic/Focus:  Wrap-Up Group:   The focus of this group is to help patients review their daily goal of treatment and discuss progress on daily workbooks.  Participation Level:  Minimal  Participation Quality:  Inattentive and Resistant  Affect:  Blunted, Depressed, Flat, Irritable, Lethargic and Resistant  Cognitive:  Disorganized, Confused, Delusional and Lacking  Insight: Lacking and Limited  Engagement in Group:  Lacking and Limited  Modes of Intervention:  Discussion  Additional Comments: Pt stated her goal for today was to focus on her treatment plan. Pt stated she accomplished her goal. Pt rated her overall day on a 1 out of 10. Writer asked pt why she rated her day a 1. Pt could not explain. Pt stated her appetite was poor today. Pt stated her slept last night was poor. Pt nurse was made aware of the situation. Pt stated she was in physical pain. Pt stated she had some back pain. Pt rated her back pain a 10. Pt nurse was made aware of the situation. Pt deny auditory or visual hallucinations. Pt denies thoughts of harming himself or others. Pt stated that he would alert staff if anything changes.   Felipa Furnace 10/27/2019, 10:24 PM

## 2019-10-28 DIAGNOSIS — F1594 Other stimulant use, unspecified with stimulant-induced mood disorder: Secondary | ICD-10-CM

## 2019-10-28 DIAGNOSIS — F1994 Other psychoactive substance use, unspecified with psychoactive substance-induced mood disorder: Secondary | ICD-10-CM | POA: Diagnosis present

## 2019-10-28 DIAGNOSIS — F310 Bipolar disorder, current episode hypomanic: Secondary | ICD-10-CM

## 2019-10-28 MED ORDER — ONDANSETRON 4 MG PO TBDP: 4.0000 mg | ORAL_TABLET | Freq: Four times a day (QID) | ORAL | Status: DC | PRN

## 2019-10-28 MED ORDER — DULOXETINE HCL 30 MG PO CPEP
30.0000 mg | ORAL_CAPSULE | Freq: Every day | ORAL | Status: DC
  Administered 2019-10-28 – 2019-10-31 (×4): 30 mg via ORAL
  Filled 2019-10-28 (×6): qty 1

## 2019-10-28 MED ORDER — LOPERAMIDE HCL 2 MG PO CAPS: 2.0000 mg | ORAL_CAPSULE | ORAL | Status: DC | PRN

## 2019-10-28 MED ORDER — CHLORDIAZEPOXIDE HCL 25 MG PO CAPS
25.0000 mg | ORAL_CAPSULE | Freq: Four times a day (QID) | ORAL | Status: DC | PRN
  Administered 2019-10-28: 25 mg via ORAL
  Filled 2019-10-28 (×2): qty 1

## 2019-10-28 MED ORDER — HYDROXYZINE HCL 25 MG PO TABS
25.0000 mg | ORAL_TABLET | Freq: Four times a day (QID) | ORAL | Status: DC | PRN
  Administered 2019-10-29 – 2019-10-30 (×3): 25 mg via ORAL
  Filled 2019-10-28 (×5): qty 1

## 2019-10-28 MED ORDER — ADULT MULTIVITAMIN W/MINERALS CH
1.0000 | ORAL_TABLET | Freq: Every day | ORAL | Status: DC
  Administered 2019-10-28 – 2019-10-31 (×4): 1 via ORAL
  Filled 2019-10-28 (×7): qty 1

## 2019-10-28 MED ORDER — MIRTAZAPINE 15 MG PO TABS
15.0000 mg | ORAL_TABLET | Freq: Every day | ORAL | Status: DC
  Filled 2019-10-28 (×2): qty 1

## 2019-10-28 MED ORDER — THIAMINE HCL 100 MG/ML IJ SOLN
100.0000 mg | Freq: Once | INTRAMUSCULAR | Status: AC
  Administered 2019-10-28: 100 mg via INTRAMUSCULAR
  Filled 2019-10-28: qty 2

## 2019-10-28 MED ORDER — THIAMINE HCL 100 MG PO TABS
100.0000 mg | ORAL_TABLET | Freq: Every day | ORAL | Status: DC
  Administered 2019-10-29 – 2019-10-31 (×3): 100 mg via ORAL
  Filled 2019-10-28 (×5): qty 1

## 2019-10-28 MED ORDER — ARIPIPRAZOLE 10 MG PO TABS
10.0000 mg | ORAL_TABLET | Freq: Every day | ORAL | Status: DC
  Administered 2019-10-28: 10 mg via ORAL
  Filled 2019-10-28 (×3): qty 1

## 2019-10-28 MED ORDER — ARIPIPRAZOLE 5 MG PO TABS
5.0000 mg | ORAL_TABLET | Freq: Every day | ORAL | Status: DC
  Administered 2019-10-29 – 2019-10-31 (×3): 5 mg via ORAL
  Filled 2019-10-28 (×5): qty 1

## 2019-10-28 NOTE — Progress Notes (Signed)
DAR NOTE: Patient presents with flat affect and depressed mood.  Patient visible in milieu briefly with minimal interaction.  Denies suicidal thoughts, pain, auditory and visual hallucinations.  Described energy level as low and concentration as poor. Maintained on routine safety checks.  Medications given as prescribed.  Support and encouragement offered as needed. Patient is withdrawn and isolates to her room for majority of this shift.  Patient is safe on and off the unit.  Offered no complaint.
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
## 2019-10-28 NOTE — Tx Team (Signed)
Interdisciplinary Treatment and Diagnostic Plan Update  10/28/2019 Time of Session: 9:00am Kari Le MRN: [id number]  Principal Diagnosis: <principal problem not specified>  Secondary Diagnoses: Active Problems:   Bipolar affective disorder ([place])   Current Medications:  Current Facility-Administered Medications  Medication Dose Route Frequency Provider Last Rate Last Admin  . alum & mag hydroxide-simeth (MAALOX/MYLANTA) 200-200-20 MG/5ML suspension 30 mL  30 mL Oral Q4H PRN Patrecia Pour, NP      . chlordiazePOXIDE (LIBRIUM) capsule 25 mg  25 mg Oral TID PRN Patrecia Pour, NP      . cloNIDine (CATAPRES) tablet 0.1 mg  0.1 mg Oral QID Patrecia Pour, NP   0.1 mg at 10/28/19 6387   Followed by  . [START ON 10/30/2019] cloNIDine (CATAPRES) tablet 0.1 mg  0.1 mg Oral BH-qamhs Lord, Asa Saunas, NP       Followed by  . [START ON 11/02/2019] cloNIDine (CATAPRES) tablet 0.1 mg  0.1 mg Oral QAC breakfast Patrecia Pour, NP      . dicyclomine (BENTYL) tablet 20 mg  20 mg Oral Q6H PRN Patrecia Pour, NP      . hydrOXYzine (ATARAX/VISTARIL) tablet 25 mg  25 mg Oral Q6H PRN Patrecia Pour, NP   25 mg at 10/27/19 2118  . ibuprofen (ADVIL) tablet 600 mg  600 mg Oral Q6H PRN Lindon Romp A, NP   600 mg at 10/27/19 2118  . loperamide (IMODIUM) capsule 2-4 mg  2-4 mg Oral PRN Patrecia Pour, NP      . magnesium hydroxide (MILK OF MAGNESIA) suspension 30 mL  30 mL Oral Daily PRN Patrecia Pour, NP      . methocarbamol (ROBAXIN) tablet 500 mg  500 mg Oral Q8H PRN Patrecia Pour, NP   500 mg at 10/27/19 2118  . ondansetron (ZOFRAN-ODT) disintegrating tablet 4 mg  4 mg Oral Q6H PRN Patrecia Pour, NP      . traZODone (DESYREL) tablet 50 mg  50 mg Oral QHS PRN Patrecia Pour, NP       PTA Medications: No medications prior to admission.    Patient Stressors:    Patient Strengths:    Treatment Modalities: Medication Management, Group therapy, Case management,  1 to 1 session with  clinician, Psychoeducation, Recreational therapy.   Physician Treatment Plan for Primary Diagnosis: <principal problem not specified> Long Term Goal(s):     Short Term Goals:    Medication Management: Evaluate patient's response, side effects, and tolerance of medication regimen.  Therapeutic Interventions: 1 to 1 sessions, Unit Group sessions and Medication administration.  Evaluation of Outcomes: Not Met  Physician Treatment Plan for Secondary Diagnosis: Active Problems:   Bipolar affective disorder ([place])  Long Term Goal(s):     Short Term Goals:       Medication Management: Evaluate patient's response, side effects, and tolerance of medication regimen.  Therapeutic Interventions: 1 to 1 sessions, Unit Group sessions and Medication administration.  Evaluation of Outcomes: Not Met   RN Treatment Plan for Primary Diagnosis: <principal problem not specified> Long Term Goal(s): Knowledge of disease and therapeutic regimen to maintain health will improve  Short Term Goals: Ability to disclose and discuss suicidal ideas, Ability to identify and develop effective coping behaviors will improve and Compliance with prescribed medications will improve  Medication Management: RN will administer medications as ordered by provider, will assess and evaluate patient's response and provide education to patient for prescribed medication. RN will report  any adverse and/or side effects to prescribing provider.  Therapeutic Interventions: 1 on 1 counseling sessions, Psychoeducation, Medication administration, Evaluate responses to treatment, Monitor vital signs and CBGs as ordered, Perform/monitor CIWA, COWS, AIMS and Fall Risk screenings as ordered, Perform wound care treatments as ordered.  Evaluation of Outcomes: Not Met   LCSW Treatment Plan for Primary Diagnosis: <principal problem not specified> Long Term Goal(s): Safe transition to appropriate next level of care at discharge, Engage  patient in therapeutic group addressing interpersonal concerns.  Short Term Goals: Engage patient in aftercare planning with referrals and resources, Increase social support, Identify triggers associated with mental health/substance abuse issues and Increase skills for wellness and recovery  Therapeutic Interventions: Assess for all discharge needs, 1 to 1 time with Social worker, Explore available resources and support systems, Assess for adequacy in community support network, Educate family and significant other(s) on suicide prevention, Complete Psychosocial Assessment, Interpersonal group therapy.  Evaluation of Outcomes: Not Met  Progress in Treatment: Attending groups: Yes. Participating in groups: Yes. Taking medication as prescribed: Yes. Toleration medication: Yes. Family/Significant other contact made: No, will contact:  supports if consents are granted. Patient understands diagnosis: Yes. Discussing patient identified problems/goals with staff: No. Medical problems stabilized or resolved: Yes. Denies suicidal/homicidal ideation: Yes. Issues/concerns per patient self-inventory: No.  New problem(s) identified: No, Describe:  CSW assessing.  New Short Term/Long Term Goal(s): detox, medication management for mood stabilization; elimination of SI thoughts; development of comprehensive mental wellness/sobriety plan.  Patient Goals:  Patient was invited to attend treatment team, remained in bed.  Discharge Plan or Barriers: Patient recently admitted to unit, CSW assessing for appropriate referrals.   Reason for Continuation of Hospitalization: Anxiety Hallucinations Medication stabilization Withdrawal symptoms  Estimated Length of Stay: 3-5 days  Attendees: Patient: 10/28/2019 9:50 AM  Physician:  10/28/2019 9:50 AM  Nursing:  10/28/2019 9:50 AM  RN Care Manager: 10/28/2019 9:50 AM  Social Worker: Stephanie Acre, [place] 10/28/2019 9:50 AM  Recreational Therapist:  10/28/2019 9:50  AM  Other: Marvia Pickles, NP 10/28/2019 9:50 AM  Other:  10/28/2019 9:50 AM  Other: 10/28/2019 9:50 AM    Scribe for Treatment Team: Joellen Jersey, [place] 10/28/2019 9:50 AM

## 2019-10-28 NOTE — Progress Notes (Signed)
Recreation Therapy Notes  Date: 5.10.21 Time: 1000 Location: [address] Dayroom  Group Topic: Triggers  Goal Area(s) Addresses:  Patient will identify triggers. Patient will identify coping skills to deal with triggers.  Intervention: Worksheet, pencils  Activity: Triggers.  Patients were to identify their three biggest triggers, how they avoid their triggers and how they deal with triggers head on when they can't be avoided.  Education: Communication, Discharge Planning  Education Outcome: Acknowledges understanding/In group clarification offered/Needs additional education.   Clinical Observations/Feedback: Pt did not attend group.    Kari Le, LRT/CTRS         Kari Le 10/28/2019 11:24 AM 

## 2019-10-28 NOTE — Progress Notes (Signed)
   10/28/19 2100  Psych Admission Type (Psych Patients Only)  Admission Status Involuntary  Psychosocial Assessment  Patient Complaints Depression  Eye Contact Avoids  Facial Expression Anxious  Affect Blunted;Depressed  Speech Pressured  Interaction Cautious  Motor Activity Slow  Appearance/Hygiene In scrubs  Behavior Characteristics Cooperative  Mood Depressed  Thought Process  Coherency Blocking  Content Ambivalence  Delusions None reported or observed  Perception WDL  Hallucination None reported or observed  Judgment WDL  Confusion None  Danger to Self  Current suicidal ideation? Denies  Danger to Others  Danger to Others None reported or observed

## 2019-10-29 LAB — LIPID PANEL
Cholesterol: 258 mg/dL — ABNORMAL HIGH (ref 0–200)
HDL: 52 mg/dL (ref >40)
LDL Cholesterol: 172 mg/dL — ABNORMAL HIGH (ref 0–99)
Total CHOL/HDL Ratio: 5 RATIO
Triglycerides: 169 mg/dL — ABNORMAL HIGH (ref <150)
VLDL: 34 mg/dL (ref 0–40)

## 2019-10-29 LAB — HEMOGLOBIN A1C
Hgb A1c MFr Bld: 5.4 % (ref 4.8–5.6)
Mean Plasma Glucose: 108.28 mg/dL
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
## 2019-10-29 NOTE — Progress Notes (Signed)
   10/29/19 2200  Psych Admission Type (Psych Patients Only)  Admission Status Involuntary  Psychosocial Assessment  Patient Complaints Substance abuse  Eye Contact Avoids  Facial Expression Anxious  Affect Blunted;Depressed  Speech Pressured  Interaction Cautious  Motor Activity Slow  Appearance/Hygiene In scrubs  Behavior Characteristics Cooperative  Mood Depressed  Thought Process  Coherency Blocking  Content Ambivalence  Delusions None reported or observed  Perception WDL  Hallucination None reported or observed  Judgment WDL  Confusion None  Danger to Self  Current suicidal ideation? Denies  Danger to Others  Danger to Others None reported or observed

## 2019-10-29 NOTE — Progress Notes (Signed)
Recreation Therapy Notes  Animal-Assisted Activity (AAA) Program Checklist/Progress Notes Patient Eligibility Criteria Checklist & Daily Group note for Rec Tx Intervention  Date: 5.11.21 Time: 1430 Location: [address] Dayroom   AAA/T Program Assumption of Risk Form signed by Patient/ or Parent Legal Guardian  YES   Patient is free of allergies or sever asthma  YES  Patient reports no fear of animals  YES  Patient reports no history of cruelty to animals  YES   Patient understands his/her participation is voluntary  YES   Patient washes hands before animal contact YES   Patient washes hands after animal contact YES  Education: Hand Washing, Appropriate Animal Interaction   Education Outcome: Acknowledges understanding/In group clarification offered/Needs additional education.   Clinical Observations/Feedback: Pt did not attend group activity.    Kari Le, LRT/CTRS         Kari Le 10/29/2019 3:34 PM 

## 2019-10-29 NOTE — Progress Notes (Signed)
   10/29/19 1000  Psych Admission Type (Psych Patients Only)  Admission Status Involuntary  Psychosocial Assessment  Patient Complaints Substance abuse  Eye Contact Avoids  Facial Expression Anxious  Affect Blunted;Depressed  Speech Pressured  Interaction Cautious  Motor Activity Slow  Appearance/Hygiene In scrubs  Behavior Characteristics Cooperative  Mood Depressed  Thought Process  Coherency Blocking  Content Ambivalence  Delusions None reported or observed  Perception WDL  Hallucination None reported or observed  Judgment WDL  Confusion None  Danger to Self  Current suicidal ideation? Denies  Danger to Others  Danger to Others None reported or observed

## 2019-10-30 MED ORDER — NICOTINE POLACRILEX 2 MG MT GUM: 2.0000 mg | CHEWING_GUM | OROMUCOSAL | Status: DC | PRN

## 2019-10-30 NOTE — Progress Notes (Deleted)
Pt stated she was doing a little better, hopeful for D/C soon

## 2019-10-30 NOTE — Progress Notes (Signed)
Psychoeducational Group Note  Date:  10/30/2019 Time: 2030  Group Topic/Focus:  wrap u group  Participation Level: Did Not Attend  Participation Quality:  Not Applicable  Affect:  Not Applicable  Cognitive:  Not Applicable  Insight:  Not Applicable  Engagement in Group: Not Applicable  Additional Comments:  Pt was notified that group was beginning but remained in bed.   Marcille Buffy 10/30/2019, 9:44 PM

## 2019-10-30 NOTE — BHH Group Notes (Signed)
LCSW Group Therapy Note 10/30/2019 1:56 PM  Type of Therapy and Topic: Group Therapy: Overcoming Obstacles  Participation Level: Active  Description of Group:  In this group patients will be encouraged to explore what they see as obstacles to their own wellness and recovery. They will be guided to discuss their thoughts, feelings, and behaviors related to these obstacles. The group will process together ways to cope with barriers, with attention given to specific choices patients can make. Each patient will be challenged to identify changes they are motivated to make in order to overcome their obstacles. This group will be process-oriented, with patients participating in exploration of their own experiences as well as giving and receiving support and challenge from other group members.  Therapeutic Goals: 1. Patient will identify personal and current obstacles as they relate to admission. 2. Patient will identify barriers that currently interfere with their wellness or overcoming obstacles.  3. Patient will identify feelings, thought process and behaviors related to these barriers. 4. Patient will identify two changes they are willing to make to overcome these obstacles:   Summary of Patient Progress  Rockell was engaged and participated throughout the group session. Lorry reports that she is ready to discharge home. She states she wants to be referred to an outpatient provider for medication management and therapy services. She states she plans to return home with her aunt in [place], [place]. Audianna reports her medications seem to be working well and that she has no concerns at this time.    Therapeutic Modalities:  Cognitive Behavioral Therapy Solution Focused Therapy Motivational Interviewing Relapse Prevention Therapy   Alcario Drought Clinical Social Worker
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
## 2019-10-30 NOTE — Progress Notes (Signed)
Pt stated she was doing a little better, hopeful for D/C soon    10/30/19 2200  Psych Admission Type (Psych Patients Only)  Admission Status Involuntary  Psychosocial Assessment  Patient Complaints Anxiety  Eye Contact Avoids  Facial Expression Anxious  Affect Blunted;Depressed  Speech Pressured  Interaction Cautious  Motor Activity Slow  Appearance/Hygiene In scrubs  Behavior Characteristics Cooperative  Mood Anxious  Thought Process  Coherency Blocking  Content Ambivalence  Delusions None reported or observed  Perception WDL  Hallucination None reported or observed  Judgment WDL  Confusion None  Danger to Self  Current suicidal ideation? Denies  Danger to Others  Danger to Others None reported or observed

## 2019-10-30 NOTE — Progress Notes (Signed)
Recreation Therapy Notes  Date:  5.12.21 Time: 0930 Location: 300 Hall Group Room  Group Topic: Stress Management  Goal Area(s) Addresses:  Patient will identify positive stress management techniques. Patient will identify benefits of using stress management post d/c.  Intervention: Stress Management  Activity: Guided Imagery.  LRT read a script that took patients on a journey to their peaceful place.  Patients were to listen and follow along as script was read to engage in activity.  Education:  Stress Management, Discharge Planning.   Education Outcome: Acknowledges Education  Clinical Observations/Feedback: Pt did not attend activity.     Caroll Rancher, LRT/CTRS         Lillia Abed, Julie-Anne Torain A 10/30/2019 11:05 AM
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
## 2019-10-31 MED ORDER — NICOTINE POLACRILEX 2 MG MT GUM: 2.0000 mg | CHEWING_GUM | OROMUCOSAL | 0 refills | Status: AC | PRN

## 2019-10-31 MED ORDER — DULOXETINE HCL 30 MG PO CPEP: 30.0000 mg | ORAL_CAPSULE | Freq: Every day | ORAL | 0 refills | Status: AC

## 2019-10-31 MED ORDER — ARIPIPRAZOLE 5 MG PO TABS: 5.0000 mg | ORAL_TABLET | Freq: Every day | ORAL | 0 refills | Status: AC

## 2019-10-31 MED ORDER — HYDROXYZINE HCL 25 MG PO TABS: 25.0000 mg | ORAL_TABLET | Freq: Four times a day (QID) | ORAL | 0 refills | Status: AC | PRN

## 2019-10-31 MED ORDER — TRAZODONE HCL 50 MG PO TABS: 50.0000 mg | ORAL_TABLET | Freq: Every evening | ORAL | 0 refills | Status: AC | PRN

## 2019-10-31 NOTE — Progress Notes (Signed)
  [facility] Adult Case Management Discharge Plan :  Will you be returning to the same living situation after discharge:  Yes,  patient is returning home with her aunt  At discharge, do you have transportation home?: Yes,  CSW arranged Safe Transport  Do you have the ability to pay for your medications: Yes,  Medicaid  Release of information consent forms completed and in the chart;  Patient's signature needed at discharge.  Patient to Follow up at: Follow-up Information    [facility], [facility]. Go on 11/06/2019.   Why: You have an appointment is Wednesday,  11/06/19 at 9:00 am.  This appointment will be held in person. Please be sure to bring any discharge paperwork, including your list of medications.  Contact information: [address] [place] [phone]           Next level of care provider has access to [facility] Link:yes  Safety Planning and Suicide Prevention discussed: Yes,  with the patient     Has patient been referred to the Quitline?: N/A patient is not a smoker  Patient has been referred for addiction treatment: Pt. refused referral  Maeola Sarah, LCSWA 10/31/2019, 10:04 AM
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
## 2019-10-31 NOTE — Progress Notes (Signed)
RN met with pt and reviewed pt's discharge instructions.  Pt verbalized understanding of discharge instructions and pt did not have any questions.Pt did not have any personal belongings at [facility]. Prescriptions, SRA and [facility] Transition Record paperwork were given to pt.  Pt denied SI/HI/AVH and voiced no concerns.  Pt was appreciative of the care pt received at [facility].  Patient discharged to the lobby where safe transport car was waiting to take pt home.   

## 2019-10-31 NOTE — [facility] Suicide Risk Assessment (Signed)
[facility] Discharge Suicide Risk Assessment   Principal Problem: 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[place]) Discharge Diagnoses: Principal Problem:   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[place]) Active Problems:   Bipolar affective disorder ([place])   Total Time spent with patient: 30 minutes  Musculoskeletal: Strength & Muscle Tone: within normal limits Gait & Station: normal Patient leans: N/A  Psychiatric Specialty Exam: Review of Systems no headache, no chest pain, no shortness of breath, no vomiting , no fever   Blood pressure 127/85, pulse (!) 104, temperature 98 F (36.7 C), temperature source Oral, resp. rate 16, height 5' (1.524 m), weight 61.2 kg, SpO2 98 %.Body mass index is 26.37 kg/m.  General Appearance: improving grooming   Eye Contact::  Good  Speech:  Normal Rate409  Volume:  Normal  Mood:  reports improved mood , states " I am feeling a lot better"  Affect:  more reactive  Thought Process:  Linear and Descriptions of Associations: Intact  Orientation:  Full (Time, Place, and Person)  Thought Content:  no hallucinations, no delusions, not internally preoccupied   Suicidal Thoughts:  No denies suicidal or self injurious ideations, denies homicidal or violent ideations  Homicidal Thoughts:  No  Memory:  recent and remote grossly intact   Judgement:  Other:  improving  Insight:  fair- improving  Psychomotor Activity:  Normal- no psychomotor agitation or restlessness   Concentration:  Good  Recall:  Good  Fund of Knowledge:Good  Language: Good  Akathisia:  Negative  Handed:  Right  AIMS (if indicated):     Assets:  Communication Skills Desire for Improvement Resilience  Sleep:  Number of Hours: 5  Cognition: WNL  ADL's:  Intact   Mental Status Per Nursing Assessment::   On Admission:  Suicidal ideation indicated by patient  Demographic Factors:  24, lives with aunt , has one adult daughter.   Loss Factors: Relationship stressors, recent  relapse  Historical Factors: History of methamphetamine use disorder, Reports past history of Bipolar Disorder diagnosis  Risk Reduction Factors:   Living with another person, especially a relative and Positive coping skills or problem solving skills  Continued Clinical Symptoms:  At this time patient reports she is feeling "  A lot better than when I came in". Affect is improving , presents more reactive and less anxious. No thought disorder, no SI or HI, no psychotic symptoms No disruptive or agitated behaviors on unit. Denies medication side effects and tolerating Abilify/Cymbalta well thus far. Side effects reviewed, to include risk of metabolic/motor disturbances from Abilify.   Cognitive Features That Contribute To Risk:  No gross cognitive deficits noted upon discharge. Is alert , attentive, and oriented x 3    Suicide Risk:  Mild:  Suicidal ideation of limited frequency, intensity, duration, and specificity.  There are no identifiable plans, no associated intent, mild dysphoria and related symptoms, good self-control (both objective and subjective assessment), few other risk factors, and identifiable protective factors, including available and accessible social support.  Follow-up Information    [facility], [facility]. Go on 11/06/2019.   Why: You have an appointment on 11/06/19 at 9:00 am.  This appointment will be held in person.  Contact information: [place] [address] [phone]           Plan Of Care/Follow-up recommendations:  Activity:  as tolerated  Diet:  regular Tests:  NA Other:  See below  Patient is expressing readiness for discharge and there are no current grounds for involuntary commitment .She  is leaving unit in good spirits. Plans to return home and to follow up as above .  Craige Cotta, MD 10/31/2019, 9:44 AM

## 2020-11-13 ENCOUNTER — Emergency Department (HOSPITAL_COMMUNITY)
Admission: EM | Admit: 2020-11-13 | Discharge: 2020-11-13 | Disposition: A | Discharge status: Home / Self Care | Payer: Medicaid Other | Attending: Emergency Medicine | Admitting: Emergency Medicine

## 2020-11-13 ENCOUNTER — Other Ambulatory Visit: Payer: Self-pay

## 2020-11-13 ENCOUNTER — Encounter (HOSPITAL_COMMUNITY): Payer: Self-pay

## 2020-11-13 DIAGNOSIS — F329 Major depressive disorder, single episode, unspecified: Secondary | ICD-10-CM | POA: Diagnosis not present

## 2020-11-13 DIAGNOSIS — F17210 Nicotine dependence, cigarettes, uncomplicated: Secondary | ICD-10-CM | POA: Insufficient documentation

## 2020-11-13 DIAGNOSIS — Z765 Malingerer [conscious simulation]: Secondary | ICD-10-CM | POA: Insufficient documentation

## 2020-11-13 DIAGNOSIS — R45851 Suicidal ideations: Secondary | ICD-10-CM | POA: Diagnosis not present

## 2020-11-13 DIAGNOSIS — F1910 Other psychoactive substance abuse, uncomplicated: Secondary | ICD-10-CM | POA: Insufficient documentation

## 2020-11-13 MED ORDER — LORAZEPAM 1 MG PO TABS
2.0000 mg | ORAL_TABLET | Freq: Once | ORAL | Status: AC
  Administered 2020-11-13: 2 mg via ORAL
  Filled 2020-11-13: qty 2

## 2020-11-13 NOTE — Discharge Instructions (Addendum)
You were evaluated in the Emergency Department and after careful evaluation, we did not find any emergent condition requiring admission or further testing in the hospital.  Your exam/testing today was overall reassuring.  Please return to the Emergency Department if you experience any worsening of your condition.  Thank you for allowing us to be a part of your care.  

## 2020-11-13 NOTE — BH Assessment (Signed)
Comprehensive Clinical Assessment (CCA) Note  11/13/2020 Kari Le [id number]   Disposition: Kari Nixon, NP recommends pt is psych cleared. Nursing staff to provide housing resources, pt to follow up with her OPT provider. Disposition discussed with Kari Sow, RN via secure chat and Kari Le.   Flowsheet Row ED from 11/13/2020 in [place] EMERGENCY DEPARTMENT Admission (Discharged) [facility] from 10/26/2019 in [facility] EMERGENCY DEPARTMENT  C-SSRS RISK CATEGORY Low Risk Low Risk Low Risk      The patient demonstrates the following risk factors for suicide: Chronic risk factors for suicide include: psychiatric disorder of Bipolar disorder (HCC). Acute risk factors for suicide include: Pt had suicidal thoughts before coming to the ED. Protective factors for this patient include: positive social support, positive therapeutic relationship and Pt denies, SI, HI, AVH, self-injurious behaviors and access to weapons. Considering these factors, the overall suicide risk at this point appears to be low. Patient is appropriate for outpatient follow up.  Kari Le is a 50 year old female who presents voluntary and unaccompanied to [facility]. Pt was a poor historian. Clinician asked the pt, "what brought you to the hospital?" Pt reports, before coming in the ED she was feeling suicidal (just thoughts), tired, and depressed. Pt denies, suicidal ideations, plan, intent and means. Clinician asked the pt what type of help is she seeking? Pt replied, "a little bit of rest." Pt asked to sleep for 30 more minutes. Pt has been homeless for a week and staying with friends. Pt also denies, HI, AVH, self-injurious behaviors and access to weapons.  Pt reports, using Methamphetamines four days ago. Pt's UDS is pending. Pt reports being linked to a OPT provider but did not specify if she's has a psychiatrist or counselor. Pt reports, she forgot her  providers name. Pt has previous inpatient admissions.   During the assessment, nursing staff had to stay by pt's bed due to pt constantly dosing off. Clinician observed nursing staff encourage pt to engage in the assessment after dosing off and providing slurred responses. Pt presents laying in the hospital bed sleeping. Pt's insight was poor. Pt's judgement was fair. Pt reports, if discharged she will not hurt herself or others.   Diagnosis: Major Depressive Disorder.                    Amphetamine-type substance use Disorder  *Pt reports, having family, friend supports. Pt declined for clinician to contact supports to gather additional information.*   Chief Complaint:  Chief Complaint  Patient presents with  . Suicidal   Visit Diagnosis:     CCA Screening, Triage and Referral (STR)  Patient Reported Information How did you hear about [place]? Self  Referral name: No data recorded Referral phone number: No data recorded  Whom do you see for routine medical problems? Primary Care  Practice/Facility Name: [facility].  Practice/Facility Phone Number: No data recorded Name of Contact: Kari Le.  Contact Number: UTA  Contact Fax Number: UTA  Prescriber Name: Kari Le.  Prescriber Address (if known): UTA   What Is the Reason for Your Visit/Call Today? Pt reports, wanting to rest a little bit. Pt reports, before coming to the ED she was having suicidal thoughts no plan, means or intent. Pt denies, current SI.  How Long Has This Been Causing You Problems? <Week  What Do You Feel Would Help You the Most Today? Housing Assistance   Have You Recently  Been in Any Inpatient Treatment (Hospital/Detox/Crisis Center/28-Day Program)? No data recorded Name/Location of Program/Hospital:No data recorded How Long Were You There? No data recorded When Were You Discharged? No data recorded  Have You Ever Received Services From [facility] Before? Yes  Who Do You See at [facility]?  Pt has previous ED visits.   Have You Recently Had Any Thoughts About Hurting Yourself? Yes (Pt has thoughts befoer coming to the ED but not currently.)  Are You Planning to Commit Suicide/Harm Yourself At This time? No   Have you Recently Had Thoughts About Hurting Someone Kari Le? No  Explanation: No data recorded  Have You Used Any Alcohol or Drugs in the Past 24 Hours? -- (Per chart, pt reports to EDP using meth 2 days ago. Pt reports, to clinician using meth 4 days ago.)  How Long Ago Did You Use Drugs or Alcohol? No data recorded What Did You Use and How Much? No data recorded  Do You Currently Have a Therapist/Psychiatrist? No data recorded Name of Therapist/Psychiatrist: No data recorded  Have You Been Recently Discharged From Any Office Practice or Programs? No data recorded Explanation of Discharge From Practice/Program: No data recorded    CCA Screening Triage Referral Assessment Type of Contact: Tele-Assessment  Is this Initial or Reassessment? Initial Assessment  Date Telepsych consult ordered in [facility]:  11/13/2020  Time Telepsych consult ordered in [facility]:  0247   Patient Reported Information Reviewed? Yes  Patient Left Without Being Seen? No data recorded Reason for Not Completing Assessment: No data recorded  Collateral Involvement: Pt declined for clinician contact family, friend supports.   Does Patient Have a Automotive engineer Guardian? No data recorded Name and Contact of Legal Guardian: No data recorded If Minor and Not Living with Parent(s), Who has Custody? No data recorded Is CPS involved or ever been involved? No data recorded Is APS involved or ever been involved? No data recorded  Patient Determined To Be At Risk for Harm To Self or Others Based on Review of Patient Reported Information or Presenting Complaint? No  Method: No data recorded Availability of Means: No data recorded Intent: No data recorded Notification Required: No data  recorded Additional Information for Danger to Others Potential: No data recorded Additional Comments for Danger to Others Potential: No data recorded Are There Guns or Other Weapons in Your Home? No data recorded Types of Guns/Weapons: No data recorded Are These Weapons Safely Secured?                            No data recorded Who Could Verify You Are Able To Have These Secured: No data recorded Do You Have any Outstanding Charges, Pending Court Dates, Parole/Probation? No data recorded Contacted To Inform of Risk of Harm To Self or Others: No data recorded  Location of Assessment: No data recorded  Does Patient Present under Involuntary Commitment? No  IVC Papers Initial File Date: No data recorded  [place] of Residence: [place]   Patient Currently Receiving the Following Services: -- (Pt didn't specifiy if she had a medication management or counselor.)   Determination of Need: Routine (7 days)   Options For Referral: Other: Comment (Housing resoruces.)     CCA Biopsychosocial Intake/Chief Complaint:  Per EDP note: "is a 50 y.o. year-old female with a history of IVDU presenting to the ED with chief complaint of SI. Patient stating she is having thoughts of wanting to hurt self and  others. However also stated she just wants a place to sleep tonight. Denies pain or trauma. Meth use 2 days ago. Feels anxious. Symptoms constant, mild."  Current Symptoms/Problems: Depressive symptoms, meth use.   Patient Reported Schizophrenia/Schizoaffective Diagnosis in Past: No data recorded  Strengths: Family, friend supports.  Preferences: Pt reports she wants to get a little bit of rest. Pt asked to sleep for 30 more minutes.  Abilities: No data recorded  Type of Services Patient Feels are Needed: Pt reports, she can contract for safety if discharged.   Initial Clinical Notes/Concerns: No data recorded  Mental Health Symptoms Depression:  Hopelessness; Fatigue; Sleep (too much  or little); Worthlessness   Duration of Depressive symptoms: No data recorded  Mania:  None   Anxiety:   Worrying   Psychosis:  None   Duration of Psychotic symptoms: No data recorded  Trauma:  None   Obsessions:  None   Compulsions:  None   Inattention:  No data recorded  Hyperactivity/Impulsivity:  Feeling of restlessness   Oppositional/Defiant Behaviors:  None   Emotional Irregularity:  None   Other Mood/Personality Symptoms:  No data recorded   Mental Status Exam Appearance and self-care  Stature:  Average   Weight:  Average weight   Clothing:  -- (Pt in scrubs.)   Grooming:  Normal   Cosmetic use:  None   Posture/gait:  Other (Comment) (Pt laying in the bed.)   Motor activity:  Not Remarkable   Sensorium  Attention:  Distractible   Concentration:  -- (Poor.)   Orientation:  Person; Situation   Recall/memory:  Normal   Affect and Mood  Affect:  -- (UTA)   Mood:  -- (UTA)   Relating  Eye contact:  None (Pt was sleeping throughout the assessment.)   Facial expression:  No data recorded  Attitude toward examiner:  Guarded   Thought and Language  Speech flow: Slurred   Thought content:  No data recorded  Preoccupation:  Other (Comment) (Sleeping.)   Hallucinations:  None   Organization:  No data recorded  Affiliated Computer ServicesExecutive Functions  Fund of Knowledge:  Poor   Intelligence:  No data recorded  Abstraction:  -- (UTA)   Judgement:  Fair   Reality Testing:  -- (UTA)   Insight:  Poor   Decision Making:  -- (UTA)   Social Functioning  Social Maturity:  -- Industrial/product designer(UTa)   Social Judgement:  -- (UTA)   Stress  Stressors:  Other (Comment) (Pt denies.)   Coping Ability:  Overwhelmed   Skill Deficits:  Decision making; Communication   Supports:  Family; Friends/Service system     Religion: Religion/Spirituality Are You A Religious Person?: Yes What is Your Religious Affiliation?: Environmental consultantBaptist  Leisure/Recreation: Leisure / Recreation Do You  Have Hobbies?: No  Exercise/Diet: Exercise/Diet Do You Exercise?:  (UTA) Do You Follow a Special Diet?:  (UTA) Do You Have Any Trouble Sleeping?: Yes Explanation of Sleeping Difficulties: Pt reports, not getting sleep.   CCA Employment/Education Employment/Work Situation: Employment / Work Situation Employment situation: Unemployed What is the longest time patient has a held a job?: UTA Where was the patient employed at that time?: UTA Has patient ever been in the Eli Lilly and Companymilitary?: No  Education: Education Is Patient Currently Attending School?: No Last Grade Completed: 9 Did Garment/textile technologistYou Graduate From [employer] School?: No Did You Product managerAttend College?: No Did Designer, television/film setYou Attend Graduate School?: No   CCA Family/Childhood History Family and Relationship History: Family history Marital status: Single What is your sexual orientation?:  UTA Has your sexual activity been affected by drugs, alcohol, medication, or emotional stress?: UTA Does patient have children?: Yes How many children?: 1 How is patient's relationship with their children?: UTA  Childhood History:  Childhood History By whom was/is the patient raised?: Other (Comment) (UTA) Additional childhood history information: UTA Description of patient's relationship with caregiver when they were a child: UTA Patient's description of current relationship with people who raised him/her: UTA How were you disciplined when you got in trouble as a child/adolescent?: UTA Does patient have siblings?: Yes Number of Siblings: 2 Description of patient's current relationship with siblings: UTA Did patient suffer any verbal/emotional/physical/sexual abuse as a child?: No Did patient suffer from severe childhood neglect?: No Has patient ever been sexually abused/assaulted/raped as an adolescent or adult?: No Was the patient ever a victim of a crime or a disaster?: No Witnessed domestic violence?: No Has patient been affected by domestic violence as an adult?:   (NA)  Child/Adolescent Assessment:     CCA Substance Use Alcohol/Drug Use: Alcohol / Drug Use Pain Medications: See MAR Prescriptions: See MAR Over the Counter: See MAR History of alcohol / drug use?: Yes Longest period of sobriety (when/how long): Unknown. Substance #1 Name of Substance 1: Methamphetamines. 1 - Age of First Use: UTA 1 - Amount (size/oz): Per chart, pt reports to EDP using meth 2 days ago. Pt reports, to clinician using meth 4 days ago. 1 - Frequency: Ongoing. 1 - Duration: Ongoing. 1 - Last Use / Amount: 4 days ago. 1 - Method of Aquiring: UTA 1- Route of Use: UTA    ASAM's:  Six Dimensions of Multidimensional Assessment  Dimension 1:  Acute Intoxication and/or Withdrawal Potential:   Dimension 1:  Description of individual's past and current experiences of substance use and withdrawal: UTA  Dimension 2:  Biomedical Conditions and Complications:   Dimension 2:  Description of patient's biomedical conditions and  complications: UTA  Dimension 3:  Emotional, Behavioral, or Cognitive Conditions and Complications:  Dimension 3:  Description of emotional, behavioral, or cognitive conditions and complications: UTA  Dimension 4:  Readiness to Change:  Dimension 4:  Description of Readiness to Change criteria: UTA  Dimension 5:  Relapse, Continued use, or Continued Problem Potential:  Dimension 5:  Relapse, continued use, or continued problem potential critiera description: UTA  Dimension 6:  Recovery/Living Environment:  Dimension 6:  Recovery/Iiving environment criteria description: UTA  ASAM Severity Score:    ASAM Recommended Level of Treatment:     Substance use Disorder (SUD)    Recommendations for Services/Supports/Treatments: Recommendations for Services/Supports/Treatments Recommendations For Services/Supports/Treatments: Other (Comment) (Follow up with OPT provider, housing resources.)  DSM5 Diagnoses: Patient Active Problem List   Diagnosis Date  Noted  . 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 Hepatitis B 11/15/2012  . Hyponatremia 11/07/2012  . Hyperglycemia 11/07/2012  . Acute hepatitis 11/07/2012  . Jaundice due to hepatitis 11/07/2012  . History of alcohol abuse 11/07/2012  . Elevated lipase 11/07/2012  . Back pain 09/07/2012  . H/O: substance abuse (HCC) 08/10/2012  . 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 Bipolar affective disorder (HCC) 04/19/2012  . Hx of nicotine dependence 04/19/2012  . DISLOCATION CLOSED [facility] 11/26/2007    Referrals to Alternative Service(s): Referred to Alternative Service(s):   Place:   Date:   Time:    Referred to Alternative Service(s):   Place:   Date:   Time:    Referred to Alternative Service(s):  Place:   Date:   Time:    Referred to Alternative Service(s):   Place:   Date:   Time:     Redmond Pulling, [facility]  Comprehensive Clinical Assessment (CCA) Screening, Triage and Referral Note  11/13/2020 Kari Le [id number]  Chief Complaint:  Chief Complaint  Patient presents with  . Suicidal   Visit Diagnosis:   Patient Reported Information How did you hear about [place]? Self   Referral name: No data recorded  Referral phone number: No data recorded Whom do you see for routine medical problems? Primary Care   Practice/Facility Name: [facility].   Practice/Facility Phone Number: No data recorded  Name of Contact: Kari Le.   Contact Number: UTA   Contact Fax Number: UTA   Prescriber Name: Kari Le.   Prescriber Address (if known): UTA  What Is the Reason for Your Visit/Call Today? Pt reports, wanting to rest a little bit. Pt reports, before coming to the ED she was having suicidal thoughts no plan, means or intent. Pt denies, current SI.  How Long Has This Been Causing You Problems? <Week  Have You Recently Been in Any Inpatient Treatment (Hospital/Detox/Crisis Center/28-Day Program)? No data recorded  Name/Location of  Program/Hospital:No data recorded  How Long Were You There? No data recorded  When Were You Discharged? No data recorded Have You Ever Received Services From [facility] Before? Yes   Who Do You [facility] at [facility]? Pt has previous ED visits.  Have You Recently Had Any Thoughts About Hurting Yourself? Yes (Pt has thoughts befoer coming to the ED but not currently.)   Are You Planning to Commit Suicide/Harm Yourself At This time?  No  Have you Recently Had Thoughts About Hurting Someone Kari Le? No   Explanation: No data recorded Have You Used Any Alcohol or Drugs in the Past 24 Hours? -- (Per chart, pt reports to EDP using meth 2 days ago. Pt reports, to clinician using meth 4 days ago.)   How Long Ago Did You Use Drugs or Alcohol?  No data recorded  What Did You Use and How Much? No data recorded What Do You Feel Would Help You the Most Today? Housing Assistance  Do You Currently Have a Therapist/Psychiatrist? No data recorded  Name of Therapist/Psychiatrist: No data recorded  Have You Been Recently Discharged From Any Office Practice or Programs? No data recorded  Explanation of Discharge From Practice/Program:  No data recorded    CCA Screening Triage Referral Assessment Type of Contact: Tele-Assessment   Is this Initial or Reassessment? Initial Assessment   Date Telepsych consult ordered in [facility]:  11/13/2020   Time Telepsych consult ordered in [facility]:  0247  Patient Reported Information Reviewed? Yes   Patient Left Without Being Seen? No data recorded  Reason for Not Completing Assessment: No data recorded Collateral Involvement: Pt declined for clinician contact family, friend supports.  Does Patient Have a Automotive engineer Guardian? No data recorded  Name and Contact of Legal Guardian:  No data recorded If Minor and Not Living with Parent(s), Who has Custody? No data recorded Is CPS involved or ever been involved? No data recorded Is APS involved or ever been involved?  No data recorded Patient Determined To Be At Risk for Harm To Self or Others Based on Review of Patient Reported Information or Presenting Complaint? No   Method: No data recorded  Availability of Means: No data recorded  Intent: No data recorded  Notification Required: No data  recorded  Additional Information for Danger to Others Potential:  No data recorded  Additional Comments for Danger to Others Potential:  No data recorded  Are There Guns or Other Weapons in Your Home?  No data recorded   Types of Guns/Weapons: No data recorded   Are These Weapons Safely Secured?                              No data recorded   Who Could Verify You Are Able To Have These Secured:    No data recorded Do You Have any Outstanding Charges, Pending Court Dates, Parole/Probation? No data recorded Contacted To Inform of Risk of Harm To Self or Others: No data recorded Location of Assessment: No data recorded Does Patient Present under Involuntary Commitment? No   IVC Papers Initial File Date: No data recorded  [place] of Residence: [place]  Patient Currently Receiving the Following Services: -- (Pt didn't specifiy if she had a medication management or counselor.)   Determination of Need: Routine (7 days)   Options For Referral: Other: Comment (Housing resoruces.)   Redmond Pulling, [facility]       Redmond Pulling, MS, [facility], [facility] Triage Specialist [phone]

## 2020-11-13 NOTE — Progress Notes (Signed)
Pt changed out into purple scrubs, pt's belongings locked in locker, security has wanded pt. While dressing out, pt refused to give up her money. Pt put everything else in the bag that is now secured. RN explained to pt it would be locked up and she would get it back when she left, pt verbalized not trusting Charity fundraiser. Asked pt how much money it was in case she lost it since she was not going to allow it to be locked up, pt stated it was $20. Law enforcement sitting outside pt's room.
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
## 2020-11-13 NOTE — ED Triage Notes (Signed)
Pt presents to ED by [employer] for SI.  Pt states she "is homeless and just wants somewhere safe to sleep and something for my nerves." pt denies any plan to harm self, says she is just having thoughts.

## 2020-11-13 NOTE — ED Notes (Signed)
Pt refused to sign discharge papers. Pt seen leaving down hall.

## 2021-02-07 IMAGING — DX DG CHEST 1V PORT
1 series · 1 of 1 positions shown · non-contrast
Comparison: Chest radiograph dated 07/05/2019.

CLINICAL DATA: 48-year-old female with chest pain.

EXAM:
PORTABLE CHEST 1 VIEW

[chest ap]
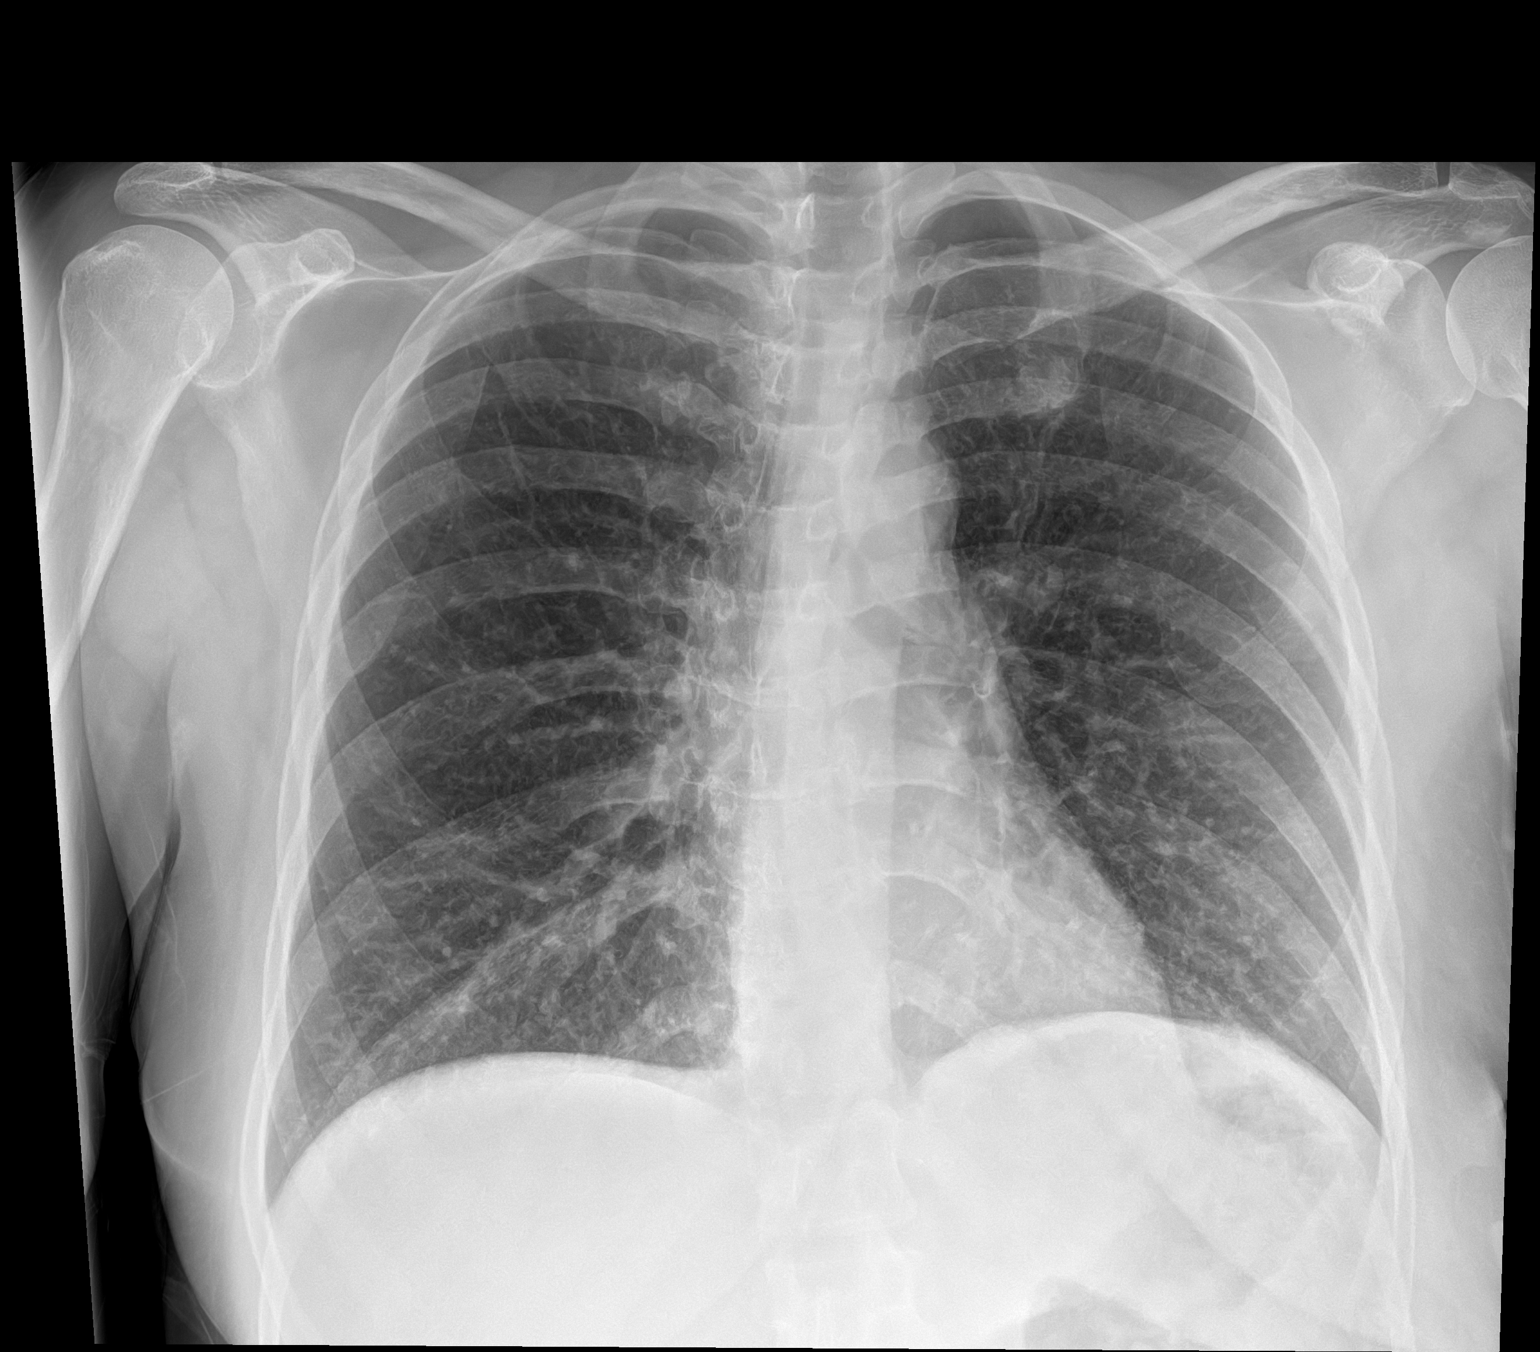

[1 of 1 positions shown; findings below may reference images not displayed]

FINDINGS: The heart size and mediastinal contours are within normal limits.
Both lungs are clear. The visualized skeletal structures are
unremarkable.
IMPRESSION: No active disease.

## 2021-02-23 ENCOUNTER — Encounter (HOSPITAL_COMMUNITY): Payer: Self-pay

## 2021-02-23 ENCOUNTER — Other Ambulatory Visit: Payer: Self-pay

## 2021-02-23 ENCOUNTER — Emergency Department (HOSPITAL_COMMUNITY)
Admission: EM | Admit: 2021-02-23 | Discharge: 2021-02-24 | Discharge status: Against Medical Advice | Payer: Medicaid Other | Attending: Emergency Medicine | Admitting: Emergency Medicine

## 2021-02-23 DIAGNOSIS — F1110 Opioid abuse, uncomplicated: Secondary | ICD-10-CM | POA: Insufficient documentation

## 2021-02-23 DIAGNOSIS — F17210 Nicotine dependence, cigarettes, uncomplicated: Secondary | ICD-10-CM | POA: Diagnosis not present

## 2021-02-23 DIAGNOSIS — F1990 Other psychoactive substance use, unspecified, uncomplicated: Secondary | ICD-10-CM

## 2021-02-23 DIAGNOSIS — F329 Major depressive disorder, single episode, unspecified: Secondary | ICD-10-CM | POA: Diagnosis not present

## 2021-02-23 DIAGNOSIS — Z79899 Other long term (current) drug therapy: Secondary | ICD-10-CM | POA: Diagnosis not present

## 2021-02-23 DIAGNOSIS — Z046 Encounter for general psychiatric examination, requested by authority: Secondary | ICD-10-CM | POA: Diagnosis present

## 2021-02-23 DIAGNOSIS — Z20822 Contact with and (suspected) exposure to covid-19: Secondary | ICD-10-CM | POA: Diagnosis not present

## 2021-02-23 DIAGNOSIS — F1510 Other stimulant abuse, uncomplicated: Secondary | ICD-10-CM | POA: Diagnosis not present

## 2021-02-23 LAB — COMPREHENSIVE METABOLIC PANEL
ALT: 17 U/L (ref 0–44)
AST: 29 U/L (ref 15–41)
Albumin: 4 g/dL (ref 3.5–5.0)
Alkaline Phosphatase: 69 U/L (ref 38–126)
Anion gap: 7 (ref 5–15)
BUN: 28 mg/dL — ABNORMAL HIGH (ref 6–20)
CO2: 23 mmol/L (ref 22–32)
Calcium: 9.1 mg/dL (ref 8.9–10.3)
Chloride: 106 mmol/L (ref 98–111)
Creatinine, Ser: 0.91 mg/dL (ref 0.44–1.00)
GFR, Estimated: >60 mL/min (ref >60)
Glucose, Bld: 106 mg/dL — ABNORMAL HIGH (ref 70–99)
Potassium: 3.4 mmol/L — ABNORMAL LOW (ref 3.5–5.1)
Sodium: 136 mmol/L (ref 135–145)
Total Bilirubin: 0.4 mg/dL (ref 0.3–1.2)
Total Protein: 7.1 g/dL (ref 6.5–8.1)

## 2021-02-23 LAB — CBC
HCT: 38.4 % (ref 36.0–46.0)
Hemoglobin: 12.4 g/dL (ref 12.0–15.0)
MCH: 26.4 pg (ref 26.0–34.0)
MCHC: 32.3 g/dL (ref 30.0–36.0)
MCV: 81.9 fL (ref 80.0–100.0)
Platelets: 306 10*3/uL (ref 150–400)
RBC: 4.69 MIL/uL (ref 3.87–5.11)
RDW: 14.9 % (ref 11.5–15.5)
WBC: 8.8 10*3/uL (ref 4.0–10.5)
nRBC: 0 % (ref 0.0–0.2)

## 2021-02-23 LAB — RESP PANEL BY RT-PCR (FLU A&B, COVID) ARPGX2
Influenza A by PCR: NEGATIVE
Influenza B by PCR: NEGATIVE
SARS Coronavirus 2 by RT PCR: NEGATIVE

## 2021-02-23 LAB — URINALYSIS, ROUTINE W REFLEX MICROSCOPIC
Bilirubin Urine: NEGATIVE
Glucose, UA: NEGATIVE mg/dL
Ketones, ur: 15 mg/dL — AB
Leukocytes,Ua: NEGATIVE
Nitrite: NEGATIVE
Protein, ur: NEGATIVE mg/dL
Specific Gravity, Urine: 1.01 (ref 1.005–1.030)
pH: 6.5 (ref 5.0–8.0)

## 2021-02-23 LAB — RAPID URINE DRUG SCREEN, HOSP PERFORMED
Amphetamines: POSITIVE — AB
Barbiturates: NOT DETECTED
Benzodiazepines: NOT DETECTED
Cocaine: NOT DETECTED
Opiates: NOT DETECTED
Tetrahydrocannabinol: NOT DETECTED

## 2021-02-23 LAB — ACETAMINOPHEN LEVEL: Acetaminophen (Tylenol), Serum: <10 ug/mL — ABNORMAL LOW (ref 10–30)

## 2021-02-23 LAB — ETHANOL: Alcohol, Ethyl (B): <10 mg/dL (ref <10)

## 2021-02-23 LAB — URINALYSIS, MICROSCOPIC (REFLEX)

## 2021-02-23 LAB — PREGNANCY, URINE: Preg Test, Ur: NEGATIVE

## 2021-02-23 NOTE — ED Triage Notes (Signed)
Pt arrived via EMS. EMS states they were called out by PD to transport pt here for psych evaluation. Pt states that she wants to go to [facility] behavioral for rehab and to start a medication regimen to "feel normal again".

## 2021-02-23 NOTE — ED Notes (Signed)
Updated mother on plan of care. Mother voiced concerns on wanting group home placement since pt is homeless. Mother states pt is a harm to herself and others.

## 2021-02-23 NOTE — ED Notes (Signed)
Called BH - huge number of walk in patients today and no TTS have been done with E.R. More staff has been called in and hopefully the TTS will get caught up tonight
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
## 2021-02-23 NOTE — ED Notes (Signed)
Pt went with sitter to do TTS in family room. Per sitter, pt walked out of room and refused evaluation. Placed back in H6.

## 2021-02-23 NOTE — ED Notes (Signed)
Lunch tray given to pt.

## 2021-02-23 NOTE — ED Notes (Signed)
Dr Bernette Mayers notified of pt's TTS refusal. Pt talked to, agreeable to TTS eval. Understands that if she refuses TTS again, she will be discharged.

## 2021-02-23 NOTE — ED Notes (Signed)
TTs requested pt's permission to let them call her mother   pt gave consent for her mother to be contacted. Kari Le

## 2021-02-24 NOTE — ED Notes (Signed)
Pt woke up and stood on the side of the bed. She then started walking away and this RN asked her where she was going and pt stated, "to the restroom". Pt then walked past the restroom and towards the exit doors. This RN went to ask pt if she needed help with something and pt continued to walk out with no response. Dr. Estell Harpin made aware.

## 2021-02-24 NOTE — ED Notes (Signed)
Pt sleeping with even and unlabored respirations. Will obtain vitals when pt awakens. 

## 2022-05-26 ENCOUNTER — Emergency Department (HOSPITAL_COMMUNITY)
Admission: EM | Admit: 2022-05-26 | Discharge: 2022-05-27 | Disposition: A | Discharge status: Other Acute Inpatient Hospital | Payer: Medicaid Other | Attending: Emergency Medicine | Admitting: Emergency Medicine

## 2022-05-26 ENCOUNTER — Other Ambulatory Visit: Payer: Self-pay

## 2022-05-26 DIAGNOSIS — F1914 Other psychoactive substance abuse with psychoactive substance-induced mood disorder: Secondary | ICD-10-CM | POA: Diagnosis present

## 2022-05-26 DIAGNOSIS — F1510 Other stimulant abuse, uncomplicated: Secondary | ICD-10-CM | POA: Diagnosis not present

## 2022-05-26 DIAGNOSIS — R45851 Suicidal ideations: Secondary | ICD-10-CM | POA: Diagnosis not present

## 2022-05-26 DIAGNOSIS — F1999 Other psychoactive substance use, unspecified with unspecified psychoactive substance-induced disorder: Secondary | ICD-10-CM | POA: Diagnosis present

## 2022-05-26 DIAGNOSIS — F1994 Other psychoactive substance use, unspecified with psychoactive substance-induced mood disorder: Secondary | ICD-10-CM | POA: Diagnosis present

## 2022-05-26 LAB — CBC
HCT: 39.3 % (ref 36.0–46.0)
Hemoglobin: 12.2 g/dL (ref 12.0–15.0)
MCH: 25.6 pg — ABNORMAL LOW (ref 26.0–34.0)
MCHC: 31 g/dL (ref 30.0–36.0)
MCV: 82.6 fL (ref 80.0–100.0)
Platelets: 378 10*3/uL (ref 150–400)
RBC: 4.76 MIL/uL (ref 3.87–5.11)
RDW: 13.4 % (ref 11.5–15.5)
WBC: 11.1 10*3/uL — ABNORMAL HIGH (ref 4.0–10.5)
nRBC: 0 % (ref 0.0–0.2)

## 2022-05-26 LAB — COMPREHENSIVE METABOLIC PANEL
ALT: 11 U/L (ref 0–44)
AST: 15 U/L (ref 15–41)
Albumin: 4 g/dL (ref 3.5–5.0)
Alkaline Phosphatase: 57 U/L (ref 38–126)
Anion gap: 9 (ref 5–15)
BUN: 17 mg/dL (ref 6–20)
CO2: 22 mmol/L (ref 22–32)
Calcium: 9.4 mg/dL (ref 8.9–10.3)
Chloride: 107 mmol/L (ref 98–111)
Creatinine, Ser: 0.72 mg/dL (ref 0.44–1.00)
GFR, Estimated: >60 mL/min (ref >60)
Glucose, Bld: 82 mg/dL (ref 70–99)
Potassium: 3.7 mmol/L (ref 3.5–5.1)
Sodium: 138 mmol/L (ref 135–145)
Total Bilirubin: 0.3 mg/dL (ref 0.3–1.2)
Total Protein: 7.4 g/dL (ref 6.5–8.1)

## 2022-05-26 LAB — RAPID URINE DRUG SCREEN, HOSP PERFORMED
Amphetamines: POSITIVE — AB
Barbiturates: NOT DETECTED
Benzodiazepines: NOT DETECTED
Cocaine: NOT DETECTED
Opiates: NOT DETECTED
Tetrahydrocannabinol: NOT DETECTED

## 2022-05-26 LAB — SALICYLATE LEVEL: Salicylate Lvl: <7 mg/dL — ABNORMAL LOW (ref 7.0–30.0)

## 2022-05-26 LAB — ACETAMINOPHEN LEVEL: Acetaminophen (Tylenol), Serum: <10 ug/mL — ABNORMAL LOW (ref 10–30)

## 2022-05-26 LAB — ETHANOL: Alcohol, Ethyl (B): <10 mg/dL (ref <10)

## 2022-05-26 MED ORDER — IBUPROFEN 800 MG PO TABS
800.0000 mg | ORAL_TABLET | Freq: Once | ORAL | Status: AC
  Administered 2022-05-26: 800 mg via ORAL
  Filled 2022-05-26: qty 1

## 2022-05-26 NOTE — ED Triage Notes (Signed)
Pt presents with toothache, while triaging, pt stated "I need to be sent away, I'm having suicidal thoughts, I just lost my daughter".

## 2022-05-26 NOTE — ED Notes (Signed)
Pt moved to room to complete TTS

## 2022-05-26 NOTE — ED Notes (Signed)
All pt rings, necklaces, and bracelets in a small bag in the larger bag with clothes.
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
## 2022-05-26 NOTE — ED Provider Notes (Addendum)
[facility] EMERGENCY DEPARTMENT Provider Note   CSN: [id number] Arrival date & time: 05/26/22  1449     History Chief Complaint  Patient presents with   Suicidal    Kari Le is a 51 y.o. female patient who presents to the emergency department today for further evaluation of left upper tooth pain and suicidal ideations.  Patient states that she recently lost her only daughter due to an overdose and has been very down and depressed since then.  She has thoughts of hurting herself.  No homicidal ideations.  No auditory or visual hallucinations.  She denies any other complaints besides the tooth.  HPI     Home Medications Prior to Admission medications   Medication Sig Start Date End Date Taking? Authorizing Provider  ARIPiprazole (ABILIFY) 5 MG tablet Take 1 tablet (5 mg total) by mouth daily. For mood control Patient not taking: No sig reported 11/01/19   Armandina Stammer I, NP  DULoxetine (CYMBALTA) 30 MG capsule Take 1 capsule (30 mg total) by mouth daily. For depression Patient not taking: No sig reported 11/01/19   Armandina Stammer I, NP  hydrOXYzine (ATARAX/VISTARIL) 25 MG tablet Take 1 tablet (25 mg total) by mouth every 6 (six) hours as needed for anxiety. Patient not taking: No sig reported 10/31/19   Armandina Stammer I, NP  nicotine polacrilex (NICORETTE) 2 MG gum Take 1 each (2 mg total) by mouth as needed. (May buy from over the counter): For smoking cessation Patient not taking: No sig reported 10/31/19   Armandina Stammer I, NP  traZODone (DESYREL) 50 MG tablet Take 1 tablet (50 mg total) by mouth at bedtime as needed for sleep. Patient not taking: No sig reported 10/31/19   Armandina Stammer I, NP      Allergies    Neurontin [gabapentin], Darvocet [propoxyphene n-acetaminophen], Hydrocodone, Ketorolac tromethamine, Tylenol [acetaminophen], and Ultram [tramadol hcl]    Review of Systems   Review of Systems  All other systems reviewed and are negative.   Physical Exam Updated  Vital Signs BP (!) 142/100   Pulse 97   Temp 98.6 F (37 C) (Oral)   Resp 18   Ht 5' (1.524 m)   Wt 65.8 kg   SpO2 96%   BMI 28.32 kg/m  Physical Exam Vitals and nursing note reviewed.  Constitutional:      General: She is not in acute distress.    Appearance: Normal appearance.  HENT:     Head: Normocephalic and atraumatic.     Mouth/Throat:     Comments: Multiple missing teeth and dental caries.  There is evidence of a broken second molar on the top left which could be the source of her pain. Eyes:     General:        Right eye: No discharge.        Left eye: No discharge.  Cardiovascular:     Comments: Regular rate and rhythm.  S1/S2 are distinct without any evidence of murmur, rubs, or gallops.  Radial pulses are 2+ bilaterally.  Dorsalis pedis pulses are 2+ bilaterally.  No evidence of pedal edema. Pulmonary:     Comments: Clear to auscultation bilaterally.  Normal effort.  No respiratory distress.  No evidence of wheezes, rales, or rhonchi heard throughout. Abdominal:     General: Abdomen is flat. Bowel sounds are normal. There is no distension.     Tenderness: There is no abdominal tenderness. There is no guarding or rebound.  Musculoskeletal:  General: Normal range of motion.     Cervical back: Neck supple.  Skin:    General: Skin is warm and dry.     Findings: No rash.  Neurological:     General: No focal deficit present.     Mental Status: She is alert.  Psychiatric:        Mood and Affect: Mood normal.        Behavior: Behavior normal.     ED Results / Procedures / Treatments   Labs (all labs ordered are listed, but only abnormal results are displayed) Labs Reviewed  COMPREHENSIVE METABOLIC PANEL  ETHANOL  SALICYLATE LEVEL  ACETAMINOPHEN LEVEL  CBC  RAPID URINE DRUG SCREEN, HOSP PERFORMED    EKG None  Radiology No results found.  Procedures Procedures    Medications Ordered in ED Medications  ibuprofen (ADVIL) tablet 800 mg (has  no administration in time range)    ED Course/ Medical Decision Making/ A&P Clinical Course as of 05/26/22 2027  Thu May 26, 2022  2026 cbc(!) There is evidence of leukocytosis. [CF]  2026 Comprehensive metabolic panel Normal. [CF]  2027 Ethanol Normal. [CF]  2027 Salicylate level(!) Normal. [CF]  2027 Acetaminophen level(!) Normal. [CF]    Clinical Course User Index [CF] Teressa Lower, PA-C                           Medical Decision Making SHERYLL DYMEK is a 51 y.o. female patient who presents to the emergency department today for further evaluation of suicidal ideations and tooth pain.  I do not see any evidence of obvious perioral abscess and I have a low suspicion for deep-seated infection.  I have a low suspicion for Ludwig's angina at this time.  Medical clearance labs were ordered.  I will check in with her after the lab result.  I also ordered her ibuprofen for her tooth pain.  Patient is medically cleared.  I will notify psych.  Disposition will be made by oncoming provider. Patient not taking any home medications.   Amount and/or Complexity of Data Reviewed Labs: ordered. Decision-making details documented in ED Course.  Risk Prescription drug management.    Final Clinical Impression(s) / ED Diagnoses Final diagnoses:  None    Rx / DC Orders ED Discharge Orders     None         Jolyn Lent 05/26/22 2028    Honor Loh [place], PA-C 05/26/22 2029    Gerhard Munch, MD 05/26/22 2150
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
## 2022-05-27 DIAGNOSIS — F1510 Other stimulant abuse, uncomplicated: Secondary | ICD-10-CM | POA: Insufficient documentation

## 2022-05-27 DIAGNOSIS — F1999 Other psychoactive substance use, unspecified with unspecified psychoactive substance-induced disorder: Secondary | ICD-10-CM

## 2022-05-27 MED ORDER — NICOTINE POLACRILEX 2 MG MT GUM: 2.0000 mg | CHEWING_GUM | OROMUCOSAL | Status: DC | PRN

## 2022-05-27 MED ORDER — ARIPIPRAZOLE 5 MG PO TABS: 5.0000 mg | ORAL_TABLET | Freq: Every day | ORAL | Status: DC

## 2022-05-27 MED ORDER — LORAZEPAM 1 MG PO TABS
1.0000 mg | ORAL_TABLET | Freq: Once | ORAL | Status: AC
  Administered 2022-05-27: 1 mg via ORAL
  Filled 2022-05-27: qty 1

## 2022-05-27 MED ORDER — HYDROXYZINE HCL 25 MG PO TABS
25.0000 mg | ORAL_TABLET | Freq: Four times a day (QID) | ORAL | Status: DC | PRN
  Administered 2022-05-27: 25 mg via ORAL
  Filled 2022-05-27: qty 1

## 2022-05-27 MED ORDER — TRAZODONE HCL 50 MG PO TABS: 50.0000 mg | ORAL_TABLET | Freq: Every evening | ORAL | Status: DC | PRN

## 2022-05-27 MED ORDER — IBUPROFEN 400 MG PO TABS
600.0000 mg | ORAL_TABLET | Freq: Once | ORAL | Status: AC
  Administered 2022-05-27: 600 mg via ORAL
  Filled 2022-05-27: qty 2

## 2022-05-27 MED ORDER — IBUPROFEN 800 MG PO TABS
800.0000 mg | ORAL_TABLET | Freq: Once | ORAL | Status: AC
  Administered 2022-05-27: 800 mg via ORAL
  Filled 2022-05-27: qty 1

## 2022-05-27 MED ORDER — OLANZAPINE 5 MG PO TBDP
5.0000 mg | ORAL_TABLET | Freq: Every day | ORAL | Status: DC
  Administered 2022-05-27: 5 mg via ORAL
  Filled 2022-05-27: qty 1

## 2022-05-27 NOTE — Progress Notes (Signed)
Pt was accept to [facility] TODAY 05/27/2022. Bed Assignment: [facility]  Pt meets inpatient criteria per Maxie Barb, NP  Attending Physician will be Dr. Carmela Rima, MD  Report can be called to: [phone]  Pt can arrive anytime today  Care Team Notified: Celso Sickle, RN, Laurey Morale, NP, and [address], LCSWA  [place], [place]  05/27/2022 4:20 PM

## 2022-05-27 NOTE — ED Notes (Signed)
Pt was accept to [facility] today 05/27/2022. Bed Assignment: [facility] Pt meets inpatient criteria per Maxie Barb, NP Attending Physician will be Dr. Carmela Rima, MD. Report can be called to: [phone] Pt can arrive anytime today. Voluntary Admission and consent for treatment has already been faxed.

## 2022-05-27 NOTE — Progress Notes (Signed)
LCSW Progress Note  [id number]   Kari Le  05/27/2022  3:42 PM  Description:   Inpatient Psychiatric Referral  Patient was recommended inpatient per Maxie Barb, NP. There are no available beds at [facility]. Patient was referred to the following facilities:   Destination Service Provider Address Phone Fax  [facility]  [address] [place] [address] [phone]  [facility]  [address] [place], [place] [address] [phone]  [facility]  [address] [place] [address] [phone]  [facility]  [address] [place], [place] [address] [phone]  [facility]  [address] [place] [address] [phone]  [facility]  [address], [place] [address] [phone]  [facility]  [address], [place] [address] [phone]  [facility]  [address] [place] [address] [phone]  [facility]  [address], [place] [address] [phone]  [facility]  [address] [place] [address] [phone]  [facility]  [address], [place] [address] [phone]  [facility]  [address], [place] [address] [phone]  [facility]  [address] [place] [address] [phone]      Situation ongoing, CSW to continue following and update chart as more information becomes available.      [name]  05/27/2022 3:42 PM
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
## 2022-05-27 NOTE — ED Notes (Signed)
Safe transport present to transport pt

## 2022-05-30 NOTE — ED Notes (Signed)
[facility] caseworker Kennith Gain reached out requesting update on Kari Le. CSW updated that pt was discharged to inpatient psych bed on 12/8.
